# Patient Record
Sex: Female | Born: 1995
Health system: Southern US, Community
[De-identification: ages and names within clinical notes are randomized; demographics above are authoritative.]

## PROBLEM LIST (undated history)

## (undated) ENCOUNTER — Emergency Department: Payer: BC Managed Care – PPO

---
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
## 2013-05-07 ENCOUNTER — Emergency Department (HOSPITAL_COMMUNITY)
Admission: EM | Admit: 2013-05-07 | Discharge: 2013-05-07 | Disposition: A | Discharge status: Home / Self Care | Payer: Federal, State, Local not specified - PPO | Source: Home / Self Care | Attending: Emergency Medicine | Admitting: Emergency Medicine

## 2013-05-07 ENCOUNTER — Encounter (HOSPITAL_COMMUNITY): Payer: Self-pay | Admitting: Emergency Medicine

## 2013-05-07 DIAGNOSIS — N3000 Acute cystitis without hematuria: Secondary | ICD-10-CM

## 2013-05-07 LAB — POCT URINALYSIS DIP (DEVICE)
Bilirubin Urine: NEGATIVE
Glucose, UA: NEGATIVE mg/dL
Nitrite: NEGATIVE
Specific Gravity, Urine: 1.025 (ref 1.005–1.030)
Urobilinogen, UA: 0.2 mg/dL (ref 0.0–1.0)

## 2013-05-07 MED ORDER — CEPHALEXIN 500 MG PO CAPS: 500.0000 mg | ORAL_CAPSULE | Freq: Three times a day (TID) | ORAL | Status: DC

## 2013-05-07 MED ORDER — PHENAZOPYRIDINE HCL 200 MG PO TABS: 200.0000 mg | ORAL_TABLET | Freq: Three times a day (TID) | ORAL | Status: DC | PRN

## 2013-05-07 NOTE — ED Provider Notes (Signed)
Chief Complaint:   Chief Complaint  Patient presents with  . Urinary Frequency    History of Present Illness:   Erica Dodson is a 17 year old female who has had a history since this morning of dysuria, frequency, urgency, and urinary odor. She denies any hematuria, fever, chills, abdominal pain, back pain, nausea, or vomiting. She's had no GYN complaints. She does have a history of frequent urinary tract infections in the past and bacterial vaginosis. She thinks that intercourse may have been a causative factor.  Review of Systems:  Other than noted above, the patient denies any of the following symptoms: General:  No fevers, chills, sweats, aches, or fatigue. GI:  No abdominal pain, back pain, nausea, vomiting, diarrhea, or constipation. GU:  No dysuria, frequency, urgency, hematuria, or incontinence. GYN:  No discharge, itching, vulvar pain or lesions, pelvic pain, or abnormal vaginal bleeding.  PMFSH:  Past medical history, family history, social history, meds, and allergies were reviewed.    Physical Exam:   Vital signs:  BP 112/72  Pulse 72  Temp(Src) 98.6 F (37 C) (Oral)  Resp 18  SpO2 100%  LMP 04/16/2013 Gen:  Alert, oriented, in no distress. Lungs:  Clear to auscultation, no wheezes, rales or rhonchi. Heart:  Regular rhythm, no gallop or murmer. Abdomen:  Flat and soft. There was slight suprapubic pain to palpation.  No guarding, or rebound.  No hepato-splenomegaly or mass.  Bowel sounds were normally active.  No hernia. Back:  No CVA tenderness.  Skin:  Clear, warm and dry.  Labs:    Results for orders placed during the hospital encounter of 05/07/13  POCT URINALYSIS DIP (DEVICE)      Result Value Range   Glucose, UA NEGATIVE  NEGATIVE mg/dL   Bilirubin Urine NEGATIVE  NEGATIVE   Ketones, ur NEGATIVE  NEGATIVE mg/dL   Specific Gravity, Urine 1.025  1.005 - 1.030   Hgb urine dipstick SMALL (*) NEGATIVE   pH 6.0  5.0 - 8.0   Protein, ur NEGATIVE  NEGATIVE mg/dL   Urobilinogen, UA 0.2  0.0 - 1.0 mg/dL   Nitrite NEGATIVE  NEGATIVE   Leukocytes, UA LARGE (*) NEGATIVE  POCT PREGNANCY, URINE      Result Value Range   Preg Test, Ur NEGATIVE  NEGATIVE     A urine culture was obtained.  Results are pending at this time and we will call about any positive results.  Assessment: The encounter diagnosis was Acute cystitis.   No evidence of acute pyelonephritis.  Plan:   1.  Meds:  The following meds were prescribed:   New Prescriptions   CEPHALEXIN (KEFLEX) 500 MG CAPSULE    Take 1 capsule (500 mg total) by mouth 3 (three) times daily.   PHENAZOPYRIDINE (PYRIDIUM) 200 MG TABLET    Take 1 tablet (200 mg total) by mouth 3 (three) times daily as needed for pain.    2.  Patient Education/Counseling:  The patient was given appropriate handouts, self care instructions, and instructed in symptomatic relief. The patient was told to avoid intercourse for 10 days, get extra fluids, and return for a follow up with her primary care doctor at the completion of treatment for a repeat UA and culture.    3.  Follow up:  The patient was told to follow up if no better in 3 to 4 days, if becoming worse in any way, and given some red flag symptoms such as fever, severe back pain, or vomiting which would prompt immediate  return.  Follow up here or at the emergency department as needed.     Reuben Likes, MD 05/07/13 [phone]

## 2013-05-07 NOTE — ED Notes (Signed)
Pt  Re[ports  She  Woke  Up  This  Am  With  Urinary  Frequency  /  Discomfort  And    Foul  Smell      Pt  Has  Had  uti  In past  Feels  Similar

## 2013-05-10 LAB — URINE CULTURE

## 2013-05-10 NOTE — ED Notes (Signed)
Urine culture: 25,000 colonies E. Coli. Pt. adequately treated with Keflex. Vassie Moselle 05/10/2013

## 2015-04-27 ENCOUNTER — Encounter: Payer: Self-pay | Admitting: Family Medicine

## 2015-04-27 ENCOUNTER — Ambulatory Visit (INDEPENDENT_AMBULATORY_CARE_PROVIDER_SITE_OTHER): Payer: Federal, State, Local not specified - PPO | Admitting: Family Medicine

## 2015-04-27 VITALS — BP 123/70 | HR 55 | Temp 98.4°F | Resp 16 | Ht 68.0 in | Wt 181.0 lb

## 2015-04-27 DIAGNOSIS — R05 Cough: Secondary | ICD-10-CM

## 2015-04-27 DIAGNOSIS — R059 Cough, unspecified: Secondary | ICD-10-CM | POA: Insufficient documentation

## 2015-04-27 DIAGNOSIS — J302 Other seasonal allergic rhinitis: Secondary | ICD-10-CM | POA: Diagnosis not present

## 2015-04-27 MED ORDER — FLUTICASONE PROPIONATE 50 MCG/ACT NA SUSP: 2.0000 | Freq: Every day | NASAL | Status: DC

## 2015-04-27 MED ORDER — LORATADINE 10 MG PO TABS: 10.0000 mg | ORAL_TABLET | Freq: Every day | ORAL | Status: DC

## 2015-04-27 MED ORDER — ALBUTEROL SULFATE HFA 108 (90 BASE) MCG/ACT IN AERS: 2.0000 | INHALATION_SPRAY | Freq: Four times a day (QID) | RESPIRATORY_TRACT | Status: DC | PRN

## 2015-04-27 NOTE — Progress Notes (Signed)
Name: Erica BassetMegan Dodson   MRN: [id number]    DOB: 01/17/1996   Date:04/27/2015       Progress Note  Subjective  Chief Complaint  Chief Complaint  Patient presents with  . chest congestion    x 1 mos    HPI C/o chest congestion and some cough x 1 month.  Occ. Has yellow sputum.  [facility] [facility] [place] [facility] [place].  None seen by him.  No fever.  No SOB.  Does not interfere with exercise. Some PND.  Mild 0cc nasal  congestion.  No problem-specific assessment & plan notes found for this encounter.   History reviewed. No pertinent past medical history.  Social History  Substance Use Topics  . Smoking status: Never Smoker   . Smokeless tobacco: Never Used  . Alcohol Use: 0.0 oz/week    0 Standard drinks or equivalent per week     Comment: occasional     Current outpatient prescriptions:  .  Norgestimate-Ethinyl Estradiol Triphasic (TRI-SPRINTEC) 0.18/0.215/0.25 MG-35 MCG tablet, Take 1 tablet by mouth at bedtime., Disp: , Rfl:   Allergies  Allergen Reactions  . Sulfa Antibiotics Hives    Review of Systems  Constitutional: Negative for fever, chills, weight loss and malaise/fatigue.  HENT: Positive for congestion. Negative for hearing loss, nosebleeds and sore throat.   Eyes: Negative for blurred vision and double vision.  Respiratory: Positive for cough and sputum production (occ.). Negative for shortness of breath and wheezing.   Cardiovascular: Negative for chest pain, palpitations and leg swelling.  Gastrointestinal: Negative for heartburn, abdominal pain and blood in stool.  Genitourinary: Negative for dysuria, urgency and frequency.  Musculoskeletal: Negative for myalgias and joint pain.  Skin: Positive for rash.  Neurological: Negative for dizziness, tremors, weakness and headaches.      Objective  Filed Vitals:   04/27/15 0810  BP: 123/70  Pulse: 55  Temp: 98.4 F (36.9 C)  TempSrc: Oral  Resp: 16  Height: 5\' 8"  (1.727 m)  Weight: 181 lb  (82.101 kg)     Physical Exam  Constitutional: She is well-developed, well-nourished, and in no distress. No distress.  HENT:  Head: Normocephalic and atraumatic.  Right Ear: External ear normal.  Left Ear: External ear normal.  Nose: No mucosal edema or rhinorrhea. Right sinus exhibits no maxillary sinus tenderness and no frontal sinus tenderness. Left sinus exhibits no maxillary sinus tenderness and no frontal sinus tenderness.  Mouth/Throat: Oropharynx is clear and moist.  Eyes: Conjunctivae and EOM are normal. Pupils are equal, round, and reactive to light.  Neck: Normal range of motion. Neck supple. No thyromegaly present.  Cardiovascular: Normal rate, regular rhythm, normal heart sounds and intact distal pulses.  Exam reveals no gallop and no friction rub.   No murmur heard. Pulmonary/Chest: Effort normal and breath sounds normal. No respiratory distress. She has no wheezes. She has no rales. She exhibits no tenderness.  Lymphadenopathy:    She has no cervical adenopathy.  Vitals reviewed.     No results found for this or any previous visit (from the past 2160 hour(s)).   Assessment & Plan  1. Seasonal allergies  - loratadine (CLARITIN) 10 MG tablet; Take 1 tablet (10 mg total) by mouth daily.  Dispense: 30 tablet; Refill: 11 - fluticasone (FLONASE) 50 MCG/ACT nasal spray; Place 2 sprays into both nostrils daily.  Dispense: 16 g; Refill: 6  2. Cough  - albuterol (PROVENTIL HFA;VENTOLIN HFA) 108 (90 BASE) MCG/ACT inhaler; Inhale 2 puffs  into the lungs every 6 (six) hours as needed for wheezing or shortness of breath.  Dispense: 1 Inhaler; Refill: 6

## 2015-05-23 ENCOUNTER — Encounter: Payer: Self-pay | Admitting: Family Medicine

## 2016-04-21 DIAGNOSIS — N390 Urinary tract infection, site not specified: Secondary | ICD-10-CM | POA: Diagnosis not present

## 2016-06-24 ENCOUNTER — Other Ambulatory Visit: Payer: Self-pay | Admitting: Family Medicine

## 2016-06-24 DIAGNOSIS — R059 Cough, unspecified: Secondary | ICD-10-CM

## 2016-06-24 DIAGNOSIS — R05 Cough: Secondary | ICD-10-CM

## 2016-07-07 DIAGNOSIS — N3020 Other chronic cystitis without hematuria: Secondary | ICD-10-CM | POA: Diagnosis not present

## 2016-07-07 DIAGNOSIS — N390 Urinary tract infection, site not specified: Secondary | ICD-10-CM | POA: Diagnosis not present

## 2016-09-26 DIAGNOSIS — R52 Pain, unspecified: Secondary | ICD-10-CM | POA: Diagnosis not present

## 2016-09-26 DIAGNOSIS — L723 Sebaceous cyst: Secondary | ICD-10-CM | POA: Diagnosis not present

## 2016-10-15 DIAGNOSIS — D485 Neoplasm of uncertain behavior of skin: Secondary | ICD-10-CM | POA: Diagnosis not present

## 2016-10-15 DIAGNOSIS — D235 Other benign neoplasm of skin of trunk: Secondary | ICD-10-CM | POA: Diagnosis not present

## 2016-10-15 DIAGNOSIS — L720 Epidermal cyst: Secondary | ICD-10-CM | POA: Diagnosis not present

## 2016-12-23 DIAGNOSIS — B9689 Other specified bacterial agents as the cause of diseases classified elsewhere: Secondary | ICD-10-CM | POA: Diagnosis not present

## 2016-12-23 DIAGNOSIS — N760 Acute vaginitis: Secondary | ICD-10-CM | POA: Diagnosis not present

## 2017-01-05 DIAGNOSIS — B373 Candidiasis of vulva and vagina: Secondary | ICD-10-CM | POA: Diagnosis not present

## 2017-01-05 DIAGNOSIS — Z202 Contact with and (suspected) exposure to infections with a predominantly sexual mode of transmission: Secondary | ICD-10-CM | POA: Diagnosis not present

## 2017-01-22 DIAGNOSIS — A64 Unspecified sexually transmitted disease: Secondary | ICD-10-CM | POA: Diagnosis not present

## 2017-01-22 DIAGNOSIS — Z01419 Encounter for gynecological examination (general) (routine) without abnormal findings: Secondary | ICD-10-CM | POA: Diagnosis not present

## 2017-01-30 ENCOUNTER — Encounter: Payer: Federal, State, Local not specified - PPO | Admitting: Family Medicine

## 2017-02-10 DIAGNOSIS — L700 Acne vulgaris: Secondary | ICD-10-CM | POA: Diagnosis not present

## 2017-03-03 DIAGNOSIS — D485 Neoplasm of uncertain behavior of skin: Secondary | ICD-10-CM | POA: Diagnosis not present

## 2017-03-03 DIAGNOSIS — D2339 Other benign neoplasm of skin of other parts of face: Secondary | ICD-10-CM | POA: Diagnosis not present

## 2017-03-03 DIAGNOSIS — L0102 Bockhart's impetigo: Secondary | ICD-10-CM | POA: Diagnosis not present

## 2017-03-12 DIAGNOSIS — Z79899 Other long term (current) drug therapy: Secondary | ICD-10-CM | POA: Diagnosis not present

## 2017-03-12 DIAGNOSIS — L700 Acne vulgaris: Secondary | ICD-10-CM | POA: Diagnosis not present

## 2017-03-23 ENCOUNTER — Encounter: Payer: Self-pay | Admitting: Nurse Practitioner

## 2017-03-23 ENCOUNTER — Ambulatory Visit (INDEPENDENT_AMBULATORY_CARE_PROVIDER_SITE_OTHER): Payer: Federal, State, Local not specified - PPO | Admitting: Nurse Practitioner

## 2017-03-23 VITALS — BP 122/60 | HR 70 | Temp 98.1°F | Ht 69.0 in | Wt 188.4 lb

## 2017-03-23 DIAGNOSIS — Z7689 Persons encountering health services in other specified circumstances: Secondary | ICD-10-CM | POA: Diagnosis not present

## 2017-03-23 DIAGNOSIS — B9689 Other specified bacterial agents as the cause of diseases classified elsewhere: Secondary | ICD-10-CM | POA: Diagnosis not present

## 2017-03-23 DIAGNOSIS — N946 Dysmenorrhea, unspecified: Secondary | ICD-10-CM | POA: Diagnosis not present

## 2017-03-23 DIAGNOSIS — N949 Unspecified condition associated with female genital organs and menstrual cycle: Secondary | ICD-10-CM | POA: Diagnosis not present

## 2017-03-23 DIAGNOSIS — N760 Acute vaginitis: Secondary | ICD-10-CM | POA: Diagnosis not present

## 2017-03-23 DIAGNOSIS — R059 Cough, unspecified: Secondary | ICD-10-CM

## 2017-03-23 DIAGNOSIS — R05 Cough: Secondary | ICD-10-CM | POA: Diagnosis not present

## 2017-03-23 LAB — POCT WET PREP (WET MOUNT): Trichomonas Wet Prep HPF POC: ABSENT

## 2017-03-23 LAB — POCT URINALYSIS DIPSTICK
Bilirubin, UA: NEGATIVE
Glucose, UA: NEGATIVE
Ketones, UA: NEGATIVE
Nitrite, UA: NEGATIVE
Protein, UA: NEGATIVE
Spec Grav, UA: 1.01 (ref 1.010–1.025)
Urobilinogen, UA: 0.2 E.U./dL
pH, UA: 5 (ref 5.0–8.0)

## 2017-03-23 MED ORDER — ALBUTEROL SULFATE HFA 108 (90 BASE) MCG/ACT IN AERS: 2.0000 | INHALATION_SPRAY | Freq: Four times a day (QID) | RESPIRATORY_TRACT | 3 refills | Status: DC | PRN

## 2017-03-23 MED ORDER — METRONIDAZOLE 0.75 % VA GEL: 1.0000 | Freq: Every day | VAGINAL | 0 refills | Status: DC

## 2017-03-23 MED ORDER — METRONIDAZOLE 0.75 % VA GEL: 1.0000 | Freq: Every day | VAGINAL | 0 refills | Status: AC

## 2017-03-23 NOTE — Assessment & Plan Note (Addendum)
Pt occasionally uses an albuterol inhaler for wheezing and shortness of breath w/ asthma worse during seasonal allergies, but it is now expired.  She would like a refill. Refill provided.

## 2017-03-23 NOTE — Progress Notes (Signed)
Subjective:    Patient ID: Erica Dodson, female    DOB: 1995-12-24, 21 y.o.   MRN: [id number]  Erica Dodson is a 21 y.o. female presenting on 03/23/2017 for Menstrual Problem (polymenorrhea x 1.5 weeks. Pt also complains of vaginal bleeding. )   HPI Dysmenorrhea Pt has been taking Triphasic OCP and is the only prescribed OCP she has ever taken.  She started this at age 36 for regulation of menses, control of acne. - Saw GYN last month in [place] Dr. Elvina Sidle at [facility] and had her PAP smear done. - Forgot pills on trip, so she missed 3 doses and has skipped those missed pills without trying to catch up.  She resumed pillpack at current day due.  Her LMP was 10/6 and was very light.  Then started bleeding again on 10/10 and notes that her bleeding stopped today. - Was sexually active on days with missed pills.  She has used backup contraception, but inconsistently.  Vaginal Burning Birthday 10/6 and went to [place].  Used Paul Mi[place]chell citrus soap and had some vaginal burning.  Has recurrent BV and is sensitive to soaps.  She had burning while on her trip, but it resolved until she used the soap again after returning and had recurrence of burning. Symptoms include:  - Vaginal burning and discomfort.  - Smell is a little different that normal menses. - Denies pelvic pain, urinary frequency, urinary urgency.  Social History  Substance Use Topics  . Smoking status: Never Smoker  . Smokeless tobacco: Never Used  . Alcohol use 0.0 oz/week     Comment: occasional    Review of Systems Per HPI unless specifically indicated above     Objective:    BP 122/60 (BP Location: Right Arm, Patient Position: Sitting, Cuff Size: Normal)   Pulse 70   Temp 98.1 F (36.7 C) (Oral)   Ht  (1.753 m)   Wt 188 lb 6.4 oz (85.5 kg)   LMP 03/14/2017   BMI 27.82 kg/m   Wt Readings from Last 3 Encounters:  03/23/17 188 lb 6.4 oz (85.5 kg)  04/27/15 181 lb (82.1 kg)      Physical Exam  General - healthy, well-appearing, NAD HEENT - Normocephalic, atraumatic Neck - supple, non-tender, no LAD Heart - RRR, no murmurs heard Lungs - Clear throughout all lobes, no wheezing, crackles, or rhonchi. Normal work of breathing. Abdomen - soft, NTND, no masses, no hepatosplenomegaly, active bowel sounds GU - Normal external female genitalia without lesions or fusion. Vaginal canal without lesions. Normal appearing cervix without lesions or friability. Physiologic discharge on exam.  Extremeties - non-tender, no edema, cap refill < 2 seconds, peripheral pulses intact +2 bilaterally Skin - warm, dry, no rashes Neuro - awake, alert, oriented x3, normal coordination, normal gait Psych - Normal mood and affect, normal behavior   Results for orders placed or performed in visit on 03/23/17  POCT urinalysis dipstick  Result Value Ref Range   Color, UA yellow    Clarity, UA clear    Glucose, UA neg    Bilirubin, UA neg    Ketones, UA neg    Spec Grav, UA 1.010 1.010 - 1.025   Blood, UA Trace    pH, UA 5.0 5.0 - 8.0   Protein, UA neg    Urobilinogen, UA 0.2 0.2 or 1.0 E.U./dL   Nitrite, UA neg    Leukocytes, UA Small (1+) (A) Negative      Assessment &  Plan:   Problem List Items Addressed This Visit      Other   Cough    Pt occasionally uses an albuterol inhaler for wheezing and shortness of breath w/ asthma worse during seasonal allergies, but it is now expired.  She would like a refill. Refill provided.      Relevant Medications   albuterol (PROVENTIL HFA;VENTOLIN HFA) 108 (90 Base) MCG/ACT inhaler    Other Visit Diagnoses    Dysmenorrhea Bleeding outside of normal menses likely related to missed OCP x 3 days and breakthrough bleeding.  Also at risk for unintended pregnancy.  Plan: 1. Urine pregnancy test negative. 2. Resume pill packs and move into next pill pack after taking all pills. 3. Use backup contraception for next 6-8 weeks until 2nd new pill  pack.  Pt verbalizes understanding. 4. Follow up as needed.  Vaginal burning    -  Primary Pt w/ vaginal irritation and history of BV.  Also w/ sensitivity to soaps vaginally.  Has had recent antibiotic use and is unsure of what is causing her symptoms.    Plan: 1. POCT wet prep: = moderate clue cells indicates BV. 2. POCT urinalyisis: negative for UTI w/ likely contamination of vaginal secretions   Relevant Medications   metroNIDAZOLE (METROGEL) 0.75 % vaginal gel   Other Relevant Orders   POCT Wet Prep [facility])   POCT urinalysis dipstick (Completed)   Encounter for assessment of STD exposure       Pt has low risk behaviors, but would like to have gonorrhea, chlamydia tested.  Declines pan testing of syphilis and HIV.  Plan: 1. GC/ Chlamydia swab sent.   Relevant Orders   POCT Wet Prep ([place] [place])   GC/Chlamydia Probe Amp   BV (bacterial vaginosis)     Acute BV w/ symptoms of odorous discharge.  Plan: 1. Avoid new soap. 2. Apply 1 applicatorfull of metrogel to vagina nightly for 5 nights.  Avoid sexual intercourse for next 7 days during treatment. 3. If recurrent BV, consider boric acid vaginal suppository. 4. Follow up prn.   Relevant Medications   metroNIDAZOLE (METROGEL) 0.75 % vaginal gel      Meds ordered this encounter  Medications  . DISCONTD: metroNIDAZOLE (METROGEL) 0.75 % vaginal gel    Sig: Place 1 Applicatorful vaginally at bedtime.    Dispense:  50 g    Refill:  0    Order Specific Question:   Supervising Provider    Answer:   Smitty Cords [[id number]]  . metroNIDAZOLE (METROGEL) 0.75 % vaginal gel    Sig: Place 1 Applicatorful vaginally at bedtime.    Dispense:  50 g    Refill:  0    Order Specific Question:   Supervising Provider    Answer:   Smitty Cords [[id number]]  . albuterol (PROVENTIL HFA;VENTOLIN HFA) 108 (90 Base) MCG/ACT inhaler    Sig: Inhale 2 puffs into the lungs every 6 (six) hours as needed for wheezing or shortness of  breath.    Dispense:  1 Inhaler    Refill:  3      Follow up plan: Return 5-7 days if symptoms worsen or fail to improve.   Wilhelmina Mcardle, DNP, AGPCNP-BC Adult Gerontology Primary Care Nurse Practitioner [facility] 03/23/2017, 5:39 PM

## 2017-03-23 NOTE — Patient Instructions (Addendum)
Erica Dodson, Thank you for coming in to clinic today.  1. For your bleeding: - Resume your current pill pack and take birth control pill daily.  Resume next pill pack as you normally would.  - Use backup contraception through your next full pill pack.  2. You do not have yeast infection.  You do have a few clue cells which can indicate you have early BV.  Please schedule a follow-up appointment with Wilhelmina Mcardle, AGNP. Return 5-7 days if symptoms worsen or fail to improve.  If you have any other questions or concerns, please feel free to call the clinic or send a message through MyChart. You may also schedule an earlier appointment if necessary.  You will receive a survey after today's visit either digitally by e-mail or paper by [employer]. Your experiences and feedback matter to [place].  Please respond so we know how we are doing as we provide care for you.   Wilhelmina Mcardle, DNP, AGNP-BC Adult Gerontology Nurse Practitioner [facility], [facility]

## 2017-03-27 ENCOUNTER — Encounter: Payer: Self-pay | Admitting: Nurse Practitioner

## 2017-03-27 DIAGNOSIS — N3000 Acute cystitis without hematuria: Secondary | ICD-10-CM

## 2017-03-27 MED ORDER — CEPHALEXIN 500 MG PO CAPS: 500.0000 mg | ORAL_CAPSULE | Freq: Two times a day (BID) | ORAL | 0 refills | Status: AC

## 2017-03-27 NOTE — Addendum Note (Signed)
Addended by: Wilhelmina McardleKENNEDY, Miyonna Ormiston R on: 03/27/2017 05:15 PM   Modules accepted: Orders

## 2017-03-30 ENCOUNTER — Ambulatory Visit (INDEPENDENT_AMBULATORY_CARE_PROVIDER_SITE_OTHER): Payer: Federal, State, Local not specified - PPO | Admitting: Nurse Practitioner

## 2017-03-30 ENCOUNTER — Encounter: Payer: Self-pay | Admitting: Nurse Practitioner

## 2017-03-30 VITALS — BP 115/57 | HR 73 | Temp 97.7°F | Ht 69.0 in | Wt 184.4 lb

## 2017-03-30 DIAGNOSIS — R300 Dysuria: Secondary | ICD-10-CM

## 2017-03-30 DIAGNOSIS — L309 Dermatitis, unspecified: Secondary | ICD-10-CM

## 2017-03-30 LAB — POCT URINALYSIS DIPSTICK
Bilirubin, UA: NEGATIVE
Blood, UA: NEGATIVE
Glucose, UA: NEGATIVE
Ketones, UA: NEGATIVE
Leukocytes, UA: NEGATIVE
Nitrite, UA: NEGATIVE
Protein, UA: NEGATIVE
Spec Grav, UA: 1.01 (ref 1.010–1.025)
Urobilinogen, UA: 0.2 E.U./dL
pH, UA: 7 (ref 5.0–8.0)

## 2017-03-30 NOTE — Progress Notes (Signed)
Subjective:    Patient ID: Martin Smeal, female    DOB: [birth year], 21 y.o.   MRN: [id number]  Aneliese Beaudry is a 21 y.o. female presenting on 03/30/2017 for Dysuria (burning after urination)   HPI Dysuria followup w/ vaginal burning: Initial visit 03/23/17 for vaginal burning and dysuria showed presence of BV.  Assumed since only small leukocytes in urine that was likely contaminant of vaginal secretions.  Pt was started on intravaginal metronidazole.  Used 2 doses before contacting via MyChart about symptoms.  Pt was instructed to stop using metronidazole and was provided empiric coverage for UTI w/ Keflex 500 mg bid x 5 days.   Persistent and worsening vaginal burning after urination.  Pt reports had irritation with intravaginal metronidazole.  Stopped using it when advised to stop.  Did start empiric UTI treatment with Keflex on Friday and has 5 doses remaining.  Reports worsening vaginal irritation w/ throbbing pain since Saturday.  Minimal improvement today, but overall still has worse vaginal burning symptoms than initially last week.  GC/Chlamydia test results: called [facility] to obtain.  Both are negative.  Trichomonas also negative on Wet Prep 03/23/17.  Reviewed w/ pt today.   Today, pt declines testing for HIV and Syphilis.   Social History  Substance Use Topics  . Smoking status: Never Smoker  . Smokeless tobacco: Never Used  . Alcohol use 0.0 oz/week     Comment: occasional    Review of Systems Per HPI unless specifically indicated above     Objective:    BP (!) 115/57 (BP Location: Right Arm, Patient Position: Sitting, Cuff Size: Normal)   Pulse 73   Temp 97.7 F (36.5 C) (Oral)   Ht 5\' 9"  (1.753 m)   Wt 184 lb 6.4 oz (83.6 kg)   LMP 03/14/2017   BMI 27.23 kg/m   Wt Readings from Last 3 Encounters:  03/30/17 184 lb 6.4 oz (83.6 kg)  03/23/17 188 lb 6.4 oz (85.5 kg)  04/27/15 181 lb (82.1 kg)    Physical Exam  Constitutional: She is oriented to person, place,  and time. She appears well-developed and well-nourished. No distress.  HENT:  Head: Normocephalic and atraumatic.  Cardiovascular: Normal rate, regular rhythm and normal heart sounds.   Pulmonary/Chest: Effort normal and breath sounds normal. No respiratory distress.  Abdominal: Hernia confirmed negative in the right inguinal area and confirmed negative in the left inguinal area.  Genitourinary: Vagina normal. No labial fusion. There is rash on the right labia. There is no tenderness, lesion or injury on the right labia. There is rash on the left labia. There is no tenderness, lesion or injury on the left labia. Cervix exhibits no motion tenderness, no discharge and no friability. No vaginal discharge found.  Genitourinary Comments: External exam consistent w/ vulvar dermatitis caused by topical metronidazole treatment.  Mild vulvar erythema present.  Lymphadenopathy:       Right: No inguinal adenopathy present.       Left: No inguinal adenopathy present.  Neurological: She is alert and oriented to person, place, and time. No cranial nerve deficit.  Skin: Skin is warm and dry.  Psychiatric: She has a normal mood and affect. Her behavior is normal. Judgment and thought content normal.   Results for orders placed or performed in visit on 03/30/17  POCT Urinalysis Dipstick  Result Value Ref Range   Color, UA yellow    Clarity, UA clear    Glucose, UA neg    Bilirubin, UA neg  Ketones, UA neg    Spec Grav, UA 1.010 1.010 - 1.025   Blood, UA neg    pH, UA 7.0 5.0 - 8.0   Protein, UA neg    Urobilinogen, UA 0.2 0.2 or 1.0 E.U./dL   Nitrite, UA neg    Leukocytes, UA Negative Negative      Assessment & Plan:   Problem List Items Addressed This Visit    None    Visit Diagnoses    Dysuria    -  Primary Pain of vulva after irritation.  Pt already on empiric treatment for UTI.  Plan: 1. Repeat UA and culture.  Pt desires urine culture be sent today even though she is already on  treatment. 2. Followup as needed.   Relevant Orders   POCT Urinalysis Dipstick (Completed)   Urine Culture   Vulvar dermatitis     Pt reported worsening vaginal burning after urination after using intravaginal metronidazole.  Vaginal exam today reveals dermatitis.  No persistent thin, white discharge indicating active BV infection.  Plan: 1. Continue mild soap and water cleansing. 2. STOP using intravaginal metronidazole (added to allergy list as intolerance). 3. Reviewed side effect of metronidazole, symptoms, and provided explanations.  Pt has had all questions answered. 4. Followup as needed in 7-10 days if symptoms persist.       Follow up plan: Return 7-10 days if symptoms worsen or fail to improve.  Wilhelmina McardleLauren Rashanna Christiana, DNP, AGPCNP-BC Adult Gerontology Primary Care Nurse Practitioner [facility] 03/30/2017, 12:11 PM

## 2017-03-31 ENCOUNTER — Other Ambulatory Visit: Payer: Self-pay

## 2017-03-31 LAB — URINE CULTURE
MICRO NUMBER:: 81179229
Result:: NO GROWTH
SPECIMEN QUALITY:: ADEQUATE

## 2017-06-11 ENCOUNTER — Other Ambulatory Visit: Payer: Self-pay

## 2017-06-11 ENCOUNTER — Other Ambulatory Visit: Payer: Self-pay | Admitting: Nurse Practitioner

## 2017-06-11 ENCOUNTER — Ambulatory Visit (INDEPENDENT_AMBULATORY_CARE_PROVIDER_SITE_OTHER): Payer: Federal, State, Local not specified - PPO | Admitting: Nurse Practitioner

## 2017-06-11 ENCOUNTER — Encounter: Payer: Self-pay | Admitting: Nurse Practitioner

## 2017-06-11 VITALS — BP 120/58 | HR 76 | Temp 98.6°F | Ht 69.0 in | Wt 181.2 lb

## 2017-06-11 DIAGNOSIS — B373 Candidiasis of vulva and vagina: Secondary | ICD-10-CM | POA: Diagnosis not present

## 2017-06-11 DIAGNOSIS — Z7689 Persons encountering health services in other specified circumstances: Secondary | ICD-10-CM | POA: Diagnosis not present

## 2017-06-11 DIAGNOSIS — B3731 Acute candidiasis of vulva and vagina: Secondary | ICD-10-CM

## 2017-06-11 LAB — POCT WET PREP (WET MOUNT)
Clue Cells Wet Prep Whiff POC: NEGATIVE
Trichomonas Wet Prep HPF POC: ABSENT

## 2017-06-11 MED ORDER — FLUCONAZOLE 150 MG PO TABS: 150.0000 mg | ORAL_TABLET | Freq: Once | ORAL | 0 refills | Status: AC

## 2017-06-11 NOTE — Patient Instructions (Addendum)
Aundra MilletMegan, Thank you for coming in to clinic today.  1. You have a yeast infection. - Take diflucan 150 mg once today.  Please schedule a follow-up appointment with Wilhelmina McardleLauren Diem Dicocco, AGNP. Return 5-7 days if symptoms worsen or fail to improve.  If you have any other questions or concerns, please feel free to call the clinic or send a message through MyChart. You may also schedule an earlier appointment if necessary.  You will receive a survey after today's visit either digitally by e-mail or paper by [employer] mail. Your experiences and feedback matter to us.  Please respond so we know how we are doing as we provide care for you.   Wilhelmina McardleLauren Evelise Reine, DNP, AGNP-BC Adult Gerontology Nurse Practitioner [facility], [facility]

## 2017-06-11 NOTE — Progress Notes (Signed)
Subjective:    Patient ID: Erica BassetMegan Conrad, female    DOB: 10/12/1995, 22 y.o.   MRN: [id number]  Erica Dodson is a 22 y.o. female presenting on 06/11/2017 for Vaginal Discharge ( vaginal odor, pt states the last time she came in diagnosis w/ BV and admits that she did not finish treatment. concern about possible exposure to STD)   HPI Vaginal Discharge Has had recurrence of vaginal discharge (thin, watery white) with odor.  Now no odor.  Used metrogel once as it was leftover from prior BV dx.  Used 2-3 doses last time, but had irritation so stopped use.  Has not had recurrence of vaginal irritation.  Encounter for possible STD exposure Unprotected sex 4 days ago.  Now more than one partner in last 6 months.  She is sexually active with only one partner at a time, but has entered a new relationship.  Desires STD testing today.  She notes new vaginal lesions, lumps, bumps, or odorous discharge.  Social History   Tobacco Use  . Smoking status: Never Smoker  . Smokeless tobacco: Never Used  Substance Use Topics  . Alcohol use: Yes    Alcohol/week: 0.0 oz    Comment: occasional  . Drug use: No    Review of Systems Per HPI unless specifically indicated above     Objective:    BP (!) 120/58 (BP Location: Right Arm, Patient Position: Sitting, Cuff Size: Normal)   Pulse 76   Temp 98.6 F (37 C) (Oral)   Ht 5\' 9"  (1.753 m)   Wt 181 lb 3.2 oz (82.2 kg)   BMI 26.76 kg/m   Wt Readings from Last 3 Encounters:  06/11/17 181 lb 3.2 oz (82.2 kg)  03/30/17 184 lb 6.4 oz (83.6 kg)  03/23/17 188 lb 6.4 oz (85.5 kg)    Physical Exam  Constitutional: She is oriented to person, place, and time. She appears well-developed and well-nourished. No distress.  HENT:  Head: Normocephalic and atraumatic.  Cardiovascular: Normal rate, regular rhythm, normal heart sounds and intact distal pulses.  Pulmonary/Chest: Effort normal and breath sounds normal. No respiratory distress.  Abdominal: Soft. Bowel  sounds are normal. She exhibits no distension and no mass. There is no tenderness.  Genitourinary: Pelvic exam was performed with patient supine.  Genitourinary Comments: Normal external female genitalia without lesions or fusion. Vaginal canal without lesions. Normal appearing cervix without lesions or friability. Thick white discharge on exam. Bimanual exam without adnexal masses, enlarged uterus, or cervical motion tenderness.  Neurological: She is alert and oriented to person, place, and time.  Skin: Skin is warm and dry.  Psychiatric: She has a normal mood and affect. Her behavior is normal. Judgment and thought content normal.     Results for orders placed or performed in visit on 06/11/17  HIV antibody  Result Value Ref Range   HIV Screen 4th Generation wRfx Non Reactive Non Reactive  RPR  Result Value Ref Range   RPR Ser Ql Non Reactive Non Reactive  HSV(herpes simplex vrs) 1+2 ab-IgG  Result Value Ref Range   HSV 1 Glycoprotein G Ab, IgG 0.96 (H) 0.00 - 0.90 index   HSV 2 IgG, Type Spec <0.91 0.00 - 0.90 index  POCT Wet Prep [employer]([facility] [place])  Result Value Ref Range   Source Wet Prep POC vagina    WBC, Wet Prep HPF POC moderate    Bacteria Wet Prep HPF POC Few Few   BACTERIA WET PREP MORPHOLOGY POC  Clue Cells Wet Prep HPF POC None None   Clue Cells Wet Prep Whiff POC Negative Whiff    Yeast Wet Prep HPF POC Moderate    KOH Wet Prep POC performed    Trichomonas Wet Prep HPF POC Absent Absent      Assessment & Plan:   Problem List Items Addressed This Visit    None    Visit Diagnoses    Vaginal candidiasis    -  Primary Acute vaginal candidiasis noted on Wet Prep with moderate yeast buds and few yeast hyphae present.    Plan: 1. START diflucan 150 mg once today. 2. Encouraged pt to keep undergarments clean and dry. 3. Reassured pt is not sexually transmitted, but can have recurrent infection after treatment of BV or with different sexual partners and changes in  vaginal pH.  Discouraged use of douching. 4. Followup as needed.   Relevant Orders   POCT Wet Prep [facility]) (Completed)   Encounter for assessment of STD exposure     Pt with acute concern for STD exposure after single encounter of unprotected sex with new partner.  Pt has had more than one partner in last 6 months and is at minimally higher risk of STD exposure with single sexual relationship at any given time to date.  No active symptoms of STD are noted.  Plan: 1. Obtain vaginal specimen for Wet Prep and Chlamydia and Gonorrhea. 2. Obtain blood specimen for HIV, RPR, HSV1/HSV2 ab. 3. Counseled pt on safe sex practices. 4. Followup as needed after labs for results.   Relevant Orders   POCT Wet Prep ([place] [place]) (Completed)   C. trachomatis/N. gonorrhoeae RNA   HIV antibody   HSV 2 antibody, IgG   HIV antibody (Completed)   RPR (Completed)   GC/Chlamydia Probe Amp   HSV(herpes simplex vrs) 1+2 ab-IgG (Completed)      Meds ordered this encounter  Medications  . fluconazole (DIFLUCAN) 150 MG tablet    Sig: Take 1 tablet (150 mg total) by mouth once for 1 dose.    Dispense:  1 tablet    Refill:  0    Order Specific Question:   Supervising Provider    Answer:   Smitty Cords [[id number]]    Follow up plan: Return 5-7 days if symptoms worsen or fail to improve.  Wilhelmina Mcardle, DNP, AGPCNP-BC Adult Gerontology Primary Care Nurse Practitioner [facility] 06/11/2017, 9:43 AM

## 2017-06-13 LAB — HSV(HERPES SIMPLEX VRS) I + II AB-IGG
HSV 1 Glycoprotein G Ab, IgG: 0.96 index — ABNORMAL HIGH (ref 0.00–0.90)
HSV 2 IgG, Type Spec: <0.91 index (ref 0.00–0.90)

## 2017-06-13 LAB — HIV ANTIBODY (ROUTINE TESTING W REFLEX): HIV Screen 4th Generation wRfx: NONREACTIVE

## 2017-06-13 LAB — RPR: RPR Ser Ql: NONREACTIVE

## 2017-06-13 LAB — GC/CHLAMYDIA PROBE AMP
Chlamydia trachomatis, NAA: NEGATIVE
Neisseria gonorrhoeae by PCR: NEGATIVE

## 2017-06-15 ENCOUNTER — Encounter: Payer: Self-pay | Admitting: Nurse Practitioner

## 2017-06-30 DIAGNOSIS — L700 Acne vulgaris: Secondary | ICD-10-CM | POA: Diagnosis not present

## 2017-06-30 DIAGNOSIS — Z79899 Other long term (current) drug therapy: Secondary | ICD-10-CM | POA: Diagnosis not present

## 2017-07-09 ENCOUNTER — Encounter: Payer: Self-pay | Admitting: Nurse Practitioner

## 2017-07-09 ENCOUNTER — Other Ambulatory Visit: Payer: Self-pay

## 2017-07-09 ENCOUNTER — Ambulatory Visit (INDEPENDENT_AMBULATORY_CARE_PROVIDER_SITE_OTHER): Payer: Federal, State, Local not specified - PPO | Admitting: Nurse Practitioner

## 2017-07-09 VITALS — BP 110/54 | HR 81 | Temp 97.3°F | Ht 69.0 in | Wt 186.2 lb

## 2017-07-09 DIAGNOSIS — N760 Acute vaginitis: Secondary | ICD-10-CM | POA: Diagnosis not present

## 2017-07-09 DIAGNOSIS — B3731 Acute candidiasis of vulva and vagina: Secondary | ICD-10-CM

## 2017-07-09 DIAGNOSIS — B9689 Other specified bacterial agents as the cause of diseases classified elsewhere: Secondary | ICD-10-CM

## 2017-07-09 DIAGNOSIS — B373 Candidiasis of vulva and vagina: Secondary | ICD-10-CM | POA: Diagnosis not present

## 2017-07-09 LAB — POCT WET PREP (WET MOUNT)
Clue Cells Wet Prep Whiff POC: NEGATIVE
Trichomonas Wet Prep HPF POC: ABSENT

## 2017-07-09 MED ORDER — METRONIDAZOLE 500 MG PO TABS: 500.0000 mg | ORAL_TABLET | Freq: Three times a day (TID) | ORAL | 0 refills | Status: DC

## 2017-07-09 MED ORDER — FLUCONAZOLE 150 MG PO TABS: 150.0000 mg | ORAL_TABLET | Freq: Once | ORAL | 0 refills | Status: AC

## 2017-07-09 NOTE — Progress Notes (Signed)
Subjective:    Patient ID: Erica Dodson, female    DOB: 08/15/1995, 22 y.o.   MRN: [id number]  Erica Dodson is a 22 y.o. female presenting on 07/09/2017 for Vaginitis (whitish vaginal discharge, itchimg. The pt used the OTC yeast cream for 1 day and itching subsided x 4 days )   HPI Vaginitis Pt reports thick white vaginal discharge onset 4-5 days ago.  Has used OTC vaginal suppository yeast infection cream x 1 day and itching subsided but discharge has not improved.  No dysuria, odor.   - Pt reports is now doing crossfit and is staying in moist gym clothing after her workouts. - Recent sexual intercourse: none   Social History   Tobacco Use  . Smoking status: Never Smoker  . Smokeless tobacco: Never Used  Substance Use Topics  . Alcohol use: Yes    Alcohol/week: 0.0 oz    Comment: occasional  . Drug use: No    Review of Systems Per HPI unless specifically indicated above     Objective:    BP (!) 110/54 (BP Location: Right Arm, Patient Position: Sitting, Cuff Size: Normal)   Pulse 81   Temp (!) 97.3 F (36.3 C) (Oral)   Ht 5\' 9"  (1.753 m)   Wt 186 lb 3.2 oz (84.5 kg)   LMP 06/23/2017 (Approximate)   BMI 27.50 kg/m   Wt Readings from Last 3 Encounters:  07/09/17 186 lb 3.2 oz (84.5 kg)  06/11/17 181 lb 3.2 oz (82.2 kg)  03/30/17 184 lb 6.4 oz (83.6 kg)    Physical Exam  General - overweight, well-appearing, NAD HEENT - Normocephalic, atraumatic Extremeties - non-tender, no edema, cap refill < 2 seconds, peripheral pulses intact +2 bilaterally GU - Normal external female genitalia without lesions or fusion. Vaginal canal without lesions. Normal appearing cervix without lesions or friability. Thick, white discharge on exam without odor. Bimanual exam without adnexal masses, enlarged uterus, or cervical motion tenderness. Skin - warm, dry Neuro - awake, alert, oriented x3, normal gait Psych - Normal mood and affect, normal behavior    Results for orders placed or  performed in visit on 07/09/17  POCT Wet Prep [facility] [place])  Result Value Ref Range   Source Wet Prep POC vaginal    WBC, Wet Prep HPF POC few    Bacteria Wet Prep HPF POC Moderate (A) Few   BACTERIA WET PREP MORPHOLOGY POC     Clue Cells Wet Prep HPF POC Moderate (A) None   Clue Cells Wet Prep Whiff POC Negative Whiff    Yeast Wet Prep HPF POC Moderate    KOH Wet Prep POC     Trichomonas Wet Prep HPF POC Absent Absent      Assessment & Plan:   Problem List Items Addressed This Visit    None    Visit Diagnoses    BV (bacterial vaginosis)    -  Primary   Relevant Medications   metroNIDAZOLE (FLAGYL) 500 MG tablet   fluconazole (DIFLUCAN) 150 MG tablet   Other Relevant Orders   POCT Wet Prep [employer]([facility] [place]) (Completed)   Vaginal candidiasis       Relevant Medications   metroNIDAZOLE (FLAGYL) 500 MG tablet   fluconazole (DIFLUCAN) 150 MG tablet   Other Relevant Orders   POCT Wet Prep [facility] [place]) (Completed)     Pt with recurrent and alternating BV and vaginal candidiasis infections.  Today, wet prep shows evidence of concurrent infections.  Will treat both conditions in  hopes of stabilizing the cycle of persistent vaginitis.  No evidence of STI and no increased risk of STI. - START diflucan 150 mg once  - START metronidazole 500 mg bid x 7 days - Encouraged pt to wear clean dry clothes after workouts. - Follouwp as needed for recurrence.  Consider boric acid for BV prevention if BV recurs.     Meds ordered this encounter  Medications  . metroNIDAZOLE (FLAGYL) 500 MG tablet    Sig: Take 1 tablet (500 mg total) by mouth 3 (three) times daily.    Dispense:  21 tablet    Refill:  0    Order Specific Question:   Supervising Provider    Answer:   Smitty Cords [[id number]]  . fluconazole (DIFLUCAN) 150 MG tablet    Sig: Take 1 tablet (150 mg total) by mouth once for 1 dose.    Dispense:  1 tablet    Refill:  0    Order Specific Question:   Supervising Provider    Answer:    Smitty Cords [[id number]]      Follow up plan: Return if symptoms worsen or fail to improve.   Wilhelmina Mcardle, DNP, AGPCNP-BC Adult Gerontology Primary Care Nurse Practitioner [facility] 07/09/2017, 12:03 PM

## 2017-07-09 NOTE — Patient Instructions (Addendum)
Aundra MilletMegan, Thank you for coming in to clinic today.  1. You have both some BV and yeast today.  Since you are having recurrent and alternating infections, I am going to treat for both. - START diflucan 150 mg tablet x 1 for one dose. - START metronidazole 500 mg bid x 7 days  2. If you still have repeated BV infections, we can use boric acid suppositories for prevention so we can prevent yeast infections after treatment of BV.  Can call clinic if these symptoms occur and we will let you know if you need an appointment.  Keep clean dry clothing after workouts.  Please schedule a follow-up appointment with Wilhelmina McardleLauren Terron Merfeld, AGNP. Return if symptoms worsen or fail to improve.  If you have any other questions or concerns, please feel free to call the clinic or send a message through MyChart. You may also schedule an earlier appointment if necessary.  You will receive a survey after today's visit either digitally by e-mail or paper by [employer] mail. Your experiences and feedback matter to us.  Please respond so we know how we are doing as we provide care for you.   Wilhelmina McardleLauren Anikin Prosser, DNP, AGNP-BC Adult Gerontology Nurse Practitioner [facility], [facility]

## 2017-08-04 DIAGNOSIS — Z79899 Other long term (current) drug therapy: Secondary | ICD-10-CM | POA: Diagnosis not present

## 2017-08-04 DIAGNOSIS — L700 Acne vulgaris: Secondary | ICD-10-CM | POA: Diagnosis not present

## 2017-08-07 ENCOUNTER — Ambulatory Visit: Payer: Federal, State, Local not specified - PPO | Admitting: Nurse Practitioner

## 2017-09-08 DIAGNOSIS — L700 Acne vulgaris: Secondary | ICD-10-CM | POA: Diagnosis not present

## 2017-09-08 DIAGNOSIS — Z79899 Other long term (current) drug therapy: Secondary | ICD-10-CM | POA: Diagnosis not present

## 2017-09-20 ENCOUNTER — Encounter: Payer: Self-pay | Admitting: Emergency Medicine

## 2017-09-20 ENCOUNTER — Ambulatory Visit
Admission: EM | Admit: 2017-09-20 | Discharge: 2017-09-20 | Disposition: A | Discharge status: Home / Self Care | Payer: Federal, State, Local not specified - PPO | Attending: Family Medicine | Admitting: Family Medicine

## 2017-09-20 ENCOUNTER — Other Ambulatory Visit: Payer: Self-pay

## 2017-09-20 ENCOUNTER — Ambulatory Visit (INDEPENDENT_AMBULATORY_CARE_PROVIDER_SITE_OTHER): Payer: Federal, State, Local not specified - PPO

## 2017-09-20 DIAGNOSIS — M79642 Pain in left hand: Secondary | ICD-10-CM

## 2017-09-20 DIAGNOSIS — S6992XA Unspecified injury of left wrist, hand and finger(s), initial encounter: Secondary | ICD-10-CM | POA: Diagnosis not present

## 2017-09-20 DIAGNOSIS — M25532 Pain in left wrist: Secondary | ICD-10-CM

## 2017-09-20 DIAGNOSIS — S62115A Nondisplaced fracture of triquetrum [cuneiform] bone, left wrist, initial encounter for closed fracture: Secondary | ICD-10-CM | POA: Diagnosis not present

## 2017-09-20 MED ORDER — OXYCODONE-ACETAMINOPHEN 5-325 MG PO TABS: 1.0000 | ORAL_TABLET | Freq: Three times a day (TID) | ORAL | 0 refills | Status: DC | PRN

## 2017-09-20 NOTE — ED Triage Notes (Signed)
Patient in today c/o left wrist pain after she hit it against a wooden post this morning. Patient has iced her wrist.

## 2017-09-20 NOTE — Discharge Instructions (Signed)
° ° ° ° ° ° ° °  Ice. Elevate.Rest. Drink plenty of fluids.   Follow up with orthopedic as discussed.   Follow up with your primary care physician this week as needed. Return to Urgent care for new or worsening concerns.         Procedures

## 2017-09-20 NOTE — ED Provider Notes (Addendum)
[facility] ____________________________________________  Time seen: Approximately 4:06 PM  I have reviewed the triage vital signs and the nursing notes.   HISTORY  Chief Complaint Wrist Injury (DOI I4/14/19)   HPI Erica Dodson is a 22 y.o. female presenting for evaluation of left wrist pain after injury that occurred earlier today.  Patient states that she was outside in a B landed on her shirt, and states that she had a spastic moment and swaddled to be away, and hit her left wrist on a wooden post.  Reports pain since injury.  Denies fall to the ground, head injury, loss of conscious or other pain or injuries.  States pain is worse in the left wrist.  Denies paresthesias, pain radiation, other pain or injuries.  No over-the-counter medications taken today prior to arrival.  Did apply some ice which helps some.  States pain is worse with movement and direct palpation.  Reports otherwise feels well.  Reports right-hand dominant.  Denies previous injuries to the same area.  Denies recent sickness. Denies recent antibiotic use.   Galen Manila, NP: PCP Patient's last menstrual period was 09/19/2017.  Denies pregnancy.   History reviewed. No pertinent past medical history.  Patient Active Problem List   Diagnosis Date Noted  . Seasonal allergies 04/27/2015  . Cough 04/27/2015    History reviewed. No pertinent surgical history.   No current facility-administered medications for this encounter.   Current Outpatient Medications:  .  CLARAVIS 40 MG capsule, Take 1 capsule by mouth 2 (two) times daily., Disp: , Rfl: 0 .  Norgestimate-Ethinyl Estradiol Triphasic (TRI-SPRINTEC) 0.18/0.215/0.25 MG-35 MCG tablet, Take 1 tablet by mouth at bedtime., Disp: , Rfl:  .  oxyCODONE-acetaminophen (PERCOCET/ROXICET) 5-325 MG tablet, Take 1 tablet by mouth every 8 (eight) hours as needed for severe pain. Do not drive while taking as can cause drowsiness., Disp: 6 tablet, Rfl:  0  Allergies Metronidazole and Sulfa antibiotics  Family History  Problem Relation Age of Onset  . Heart Problems Father   . Asthma Brother     Social History Social History   Tobacco Use  . Smoking status: Never Smoker  . Smokeless tobacco: Never Used  Substance Use Topics  . Alcohol use: Yes    Alcohol/week: 0.0 oz    Comment: occasional  . Drug use: No    Review of Systems Constitutional: No fever/chills Cardiovascular: Denies chest pain. Respiratory: Denies shortness of breath. Musculoskeletal: Negative for back pain.As abov  Skin: Negative for rash. ____________________________________   PHYSICAL EXAM:  VITAL SIGNS: ED Triage Vitals  Enc Vitals Group     BP 09/20/17 1522 113/76     Pulse Rate 09/20/17 1522 90     Resp 09/20/17 1522 16     Temp 09/20/17 1522 98.3 F (36.8 C)     Temp Source 09/20/17 1522 Oral     SpO2 09/20/17 1522 100 %     Weight 09/20/17 1521 186 lb (84.4 kg)     Height 09/20/17 1521 5\' 8"  (1.727 m)     Head Circumference --      Peak Flow --      Pain Score 09/20/17 1521 7     Pain Loc --      Pain Edu? --      Excl. in GC? --     Constitutional: Alert and oriented. Well appearing and in no acute distress. Cardiovascular: Normal rate, regular rhythm. Grossly normal heart sounds.  Good peripheral circulation. Respiratory: Normal respiratory  effort without tachypnea nor retractions. Breath sounds are clear and equal bilaterally. No wheezes, rales, rhonchi. Musculoskeletal:  No midline cervical, thoracic or lumbar tenderness to palpation. Bilateral distal radial pulses equal and easily palpated. Except: Left wrist mid to lateral dorsal wrist moderate tenderness to direct palpation with mild localized swelling, mild pain to wrist with resisted third and fourth fingers flexion and extension but full range of motion present, no motor or tendon deficit noted to left hand, normal distal sensation and capillary refill, left upper extremity  otherwise nontender. Neurologic:  Normal speech and language.  Speech is normal. No gait instability.  Skin:  Skin is warm, dry and intact. No rash noted. Psychiatric: Mood and affect are normal. Speech and behavior are normal. Patient exhibits appropriate insight and judgment   ___________________________________________   LABS (all labs ordered are listed, but only abnormal results are displayed)  Labs Reviewed - No data to display  RADIOLOGY  Dg Wrist Complete Left  Result Date: 09/20/2017 CLINICAL DATA:  Hit wrist against wooden pole EXAM: LEFT WRIST - COMPLETE 3+ VIEW COMPARISON:  None FINDINGS: Frontal, oblique lateral, and ulnar deviation scaphoid images were obtained. No evident fracture or dislocation. Joint spaces appear normal. No erosive change. No radiopaque foreign body. IMPRESSION: No fracture or dislocation. No appreciable arthropathy. No radiopaque foreign body evident. Electronically Signed   By: Bretta Bang III M.D.   On: 09/20/2017 15:49   Dg Hand Complete Left  Result Date: 09/20/2017 CLINICAL DATA:  Hit hand against wooden post EXAM: LEFT HAND - COMPLETE 3+ VIEW COMPARISON:  None. FINDINGS: Frontal, oblique, and lateral views obtained. There are tiny calcifications dorsal to the triquetrum bone which may have arthropathic etiology or possibly could represent small avulsions. No other evidence of potential fracture. No dislocation. Joint spaces appear normal. No erosive change. No radiopaque foreign body. IMPRESSION: Tiny calcifications dorsal to the triquetrum bone which may represent foci of arthropathic change or tiny avulsions. Clinical assessment of this area of wrist advised in this regard. No other evident potential fracture. No dislocation. No appreciable joint space narrowing or radiopaque foreign body. These results will be called to the ordering clinician or representative by the Radiologist Assistant, and communication documented in the PACS or zVision  Dashboard. Electronically Signed   By: Bretta Bang III M.D.   On: 09/20/2017 15:51   ____________________________________________   PROCEDURES Procedures   Left wrist placed in a volar dorsal OCL splint and sling given.  INITIAL IMPRESSION / ASSESSMENT AND PLAN / ED COURSE  Pertinent labs & imaging results that were available during my care of the patient were reviewed by me and considered in my medical decision making (see chart for details).  Well-appearing patient.  No acute distress.  Presenting for evaluation of left wrist pain post mechanical injury that occurred earlier today.  X-ray results as above per radiologist.  Per radiologist, concern for avulsion fracture to the triquetrium bone, patient is point tender in this area with swelling, suspect avulsion fracture.  Discussed in detail with patient and reviewed x-rays.  Splint applied, instructed to ice, elevate and supportive care.  Sling given.  Follow-up with orthopedic this week.  Patient states that she will take over-the-counter ibuprofen as needed for pain.  Quantity 4 Percocet given as needed for breakthrough pain, and patient states that she does not any more than this.  [place] controlled substance database reviewed and no recent controlled substances documented. Discussed indication, risks and benefits of medications with patient.  Discussed follow up with Primary care physician this week. Discussed follow up and return parameters including no resolution or any worsening concerns. Patient verbalized understanding and agreed to plan.   ____________________________________________   FINAL CLINICAL IMPRESSION(S) / ED DIAGNOSES  Final diagnoses:  Nondisplaced fracture of triquetrum (cuneiform) bone, left wrist, initial encounter for closed fracture  Left wrist pain     ED Discharge Orders        Ordered    oxyCODONE-acetaminophen (PERCOCET/ROXICET) 5-325 MG tablet  Every 8 hours PRN     09/20/17 1644        Note: This dictation was prepared with Dragon dictation along with smaller phrase technology. Any transcriptional errors that result from this process are unintentional.         Renford DillsMiller, Krishav Mamone, NP 09/20/17 1712

## 2017-09-20 NOTE — ED Notes (Signed)
Dorsal volar fiberglass splint applied to left wrist. PMS intact post application. Sling applied.

## 2017-09-29 DIAGNOSIS — R200 Anesthesia of skin: Secondary | ICD-10-CM | POA: Diagnosis not present

## 2017-09-29 DIAGNOSIS — S60212A Contusion of left wrist, initial encounter: Secondary | ICD-10-CM | POA: Diagnosis not present

## 2017-09-29 DIAGNOSIS — R202 Paresthesia of skin: Secondary | ICD-10-CM | POA: Diagnosis not present

## 2017-10-13 DIAGNOSIS — L700 Acne vulgaris: Secondary | ICD-10-CM | POA: Diagnosis not present

## 2017-10-13 DIAGNOSIS — Z79899 Other long term (current) drug therapy: Secondary | ICD-10-CM | POA: Diagnosis not present

## 2017-11-17 DIAGNOSIS — Z79899 Other long term (current) drug therapy: Secondary | ICD-10-CM | POA: Diagnosis not present

## 2017-11-17 DIAGNOSIS — L700 Acne vulgaris: Secondary | ICD-10-CM | POA: Diagnosis not present

## 2017-12-08 ENCOUNTER — Encounter: Payer: Self-pay | Admitting: Nurse Practitioner

## 2017-12-08 ENCOUNTER — Ambulatory Visit (INDEPENDENT_AMBULATORY_CARE_PROVIDER_SITE_OTHER): Payer: Federal, State, Local not specified - PPO | Admitting: Nurse Practitioner

## 2017-12-08 VITALS — BP 116/59 | HR 67 | Resp 16 | Ht 68.0 in | Wt 194.0 lb

## 2017-12-08 DIAGNOSIS — B3731 Acute candidiasis of vulva and vagina: Secondary | ICD-10-CM

## 2017-12-08 DIAGNOSIS — B373 Candidiasis of vulva and vagina: Secondary | ICD-10-CM

## 2017-12-08 MED ORDER — FLUCONAZOLE 150 MG PO TABS: 150.0000 mg | ORAL_TABLET | Freq: Once | ORAL | 1 refills | Status: AC

## 2017-12-08 NOTE — Progress Notes (Signed)
Subjective:    Patient ID: Erica Dodson, female    DOB: 10-30-1995, 22 y.o.   MRN: [id number]  Erica Dodson is a 22 y.o. female presenting on 12/08/2017 for Vaginitis (Has possible yeast inf and tried OTC meds that she feels did not help. )   HPI Vaginitis Patient presents for similar vaginitis complaints that have been treated on 10/22, 1/3, and 1/31.  These were found to be candidiasis and BV at different encounters. Since that time, she has remained largely asymptomatic.   - For current episode, she has used monistat like medication without improvement.  Is also having some bloating. She has started using a new product,  Summer's Eve, for perineal cleansing.   - No new sexual partners.  Social History   Tobacco Use  . Smoking status: Never Smoker  . Smokeless tobacco: Never Used  Substance Use Topics  . Alcohol use: Yes    Alcohol/week: 0.0 oz    Comment: occasional  . Drug use: No    Review of Systems Per HPI unless specifically indicated above     Objective:    BP (!) 116/59   Pulse 67   Resp 16   Ht 5\' 8"  (1.727 m)   Wt 194 lb (88 kg)   LMP 11/17/2017   SpO2 100%   BMI 29.50 kg/m   Wt Readings from Last 3 Encounters:  12/08/17 194 lb (88 kg)  09/20/17 186 lb (84.4 kg)  07/09/17 186 lb 3.2 oz (84.5 kg)    Physical Exam  Constitutional: She is oriented to person, place, and time. She appears well-developed and well-nourished. No distress.  HENT:  Head: Normocephalic and atraumatic.  Cardiovascular: Normal rate, regular rhythm, S1 normal, S2 normal, normal heart sounds and intact distal pulses.  Pulmonary/Chest: Effort normal and breath sounds normal. No respiratory distress.  Genitourinary:  Genitourinary Comments: Normal external female genitalia without lesions or fusion. Thick, white discharge on exam without odor. Bimanual exam without adnexal masses, enlarged uterus, or cervical motion tenderness.  Neurological: She is alert and oriented to person, place,  and time.  Skin: Skin is warm and dry.  Psychiatric: She has a normal mood and affect. Her behavior is normal.  Vitals reviewed.   Results for orders placed or performed in visit on 07/09/17  POCT Wet Prep [facility])  Result Value Ref Range   Source Wet Prep POC vaginal    WBC, Wet Prep HPF POC few    Bacteria Wet Prep HPF POC Moderate (A) Few   BACTERIA WET PREP MORPHOLOGY POC     Clue Cells Wet Prep HPF POC Moderate (A) None   Clue Cells Wet Prep Whiff POC Negative Whiff    Yeast Wet Prep HPF POC Moderate    KOH Wet Prep POC     Trichomonas Wet Prep HPF POC Absent Absent      Assessment & Plan:   Problem List Items Addressed This Visit    None    Visit Diagnoses    Yeast vaginitis    -  Primary    Clinically consistent with vaginal discharge on exam. Confirmed with candidiasis on wet prep with yeast buds and hyphae.  No clue cells and negative whiff test.  Plan: 1. Start Diflucan 150 mg tab x 1 dose.  May repeat in 3 days if no improvement. 2. Counseled on reducing recurrences with condom use, may try yogurt, probiotics, alternatively some patients are prone to recurrences with certain partners. 3. Addtionally, recommend patient  avoid using Summer's eve as this can change vaginal pH and make BV/candidiasis more likely. 4. Followup prn.   Meds ordered this encounter  Medications  . fluconazole (DIFLUCAN) 150 MG tablet    Sig: Take 1 tablet (150 mg total) by mouth once for 1 dose. Repeat in 3 days if not improved.    Dispense:  1 tablet    Refill:  1    Order Specific Question:   Supervising Provider    Answer:   Smitty CordsKARAMALEGOS, ALEXANDER J [[id number]]    Follow up plan: Return 4 weeks if symptoms worsen or fail to improve.  Erica McardleLauren Yolunda Kloos, DNP, AGPCNP-BC Adult Gerontology Primary Care Nurse Practitioner [facility] 12/08/2017, 4:43 PM

## 2017-12-08 NOTE — Patient Instructions (Addendum)
Erica Dodson,   Thank you for coming in to clinic today.  1. Yeast infection:  - Start diflucan - take one pill once today.  Repeat in 3 days if needed.  2. Anxiety - Mindfulness - great resources online and for apps that help focus on the now - 4-7-8 breathing technique: breathe in to count of 4, hold breath for count of 7, exhale for count of 8; do 3-5 times for letting go of overactive thoughts - Self - talk activities to reframe situations.  Please schedule a follow-up appointment with Cassell Smiles, AGNP. Return 4 weeks if symptoms worsen or fail to improve.  If you have any other questions or concerns, please feel free to call the clinic or send a message through [place]. You may also schedule an earlier appointment if necessary.  You will receive a survey after today's visit either digitally by e-mail or paper by [employer]. Your experiences and feedback matter to [place].  Please respond so we know how we are doing as we provide care for you.   Cassell Smiles, DNP, AGNP-BC Adult Gerontology Nurse Practitioner [facility], [facility]   Stress and Stress Management Stress is a normal reaction to life events. It is what you feel when life demands more than you are used to or more than you can handle. Some stress can be useful. For example, the stress reaction can help you catch the last bus of the day, study for a test, or meet a deadline at work. But stress that occurs too often or for too long can cause problems. It can affect your emotional health and interfere with relationships and normal daily activities. Too much stress can weaken your immune system and increase your risk for physical illness. If you already have a medical problem, stress can make it worse. What are the causes? All sorts of life events may cause stress. An event that causes stress for one person may not be stressful for another person. Major life events commonly cause stress. These may be positive or  negative. Examples include losing your job, moving into a new home, getting married, having a baby, or losing a loved one. Less obvious life events may also cause stress, especially if they occur day after day or in combination. Examples include working long hours, driving in traffic, caring for children, being in debt, or being in a difficult relationship. What are the signs or symptoms? Stress may cause emotional symptoms including, the following:  Anxiety. This is feeling worried, afraid, on edge, overwhelmed, or out of control.  Anger. This is feeling irritated or impatient.  Depression. This is feeling sad, down, helpless, or guilty.  Difficulty focusing, remembering, or making decisions.  Stress may cause physical symptoms, including the following:  Aches and pains. These may affect your head, neck, back, stomach, or other areas of your body.  Tight muscles or clenched jaw.  Low energy or trouble sleeping.  Stress may cause unhealthy behaviors, including the following:  Eating to feel better (overeating) or skipping meals.  Sleeping too little, too much, or both.  Working too much or putting off tasks (procrastination).  Smoking, drinking alcohol, or using drugs to feel better.  How is this diagnosed? Stress is diagnosed through an assessment by your health care provider. Your health care provider will ask questions about your symptoms and any stressful life events.Your health care provider will also ask about your medical history and may order blood tests or other tests. Certain medical  conditions and medicine can cause physical symptoms similar to stress. Mental illness can cause emotional symptoms and unhealthy behaviors similar to stress. Your health care provider may refer you to a mental health professional for further evaluation. How is this treated? Stress management is the recommended treatment for stress.The goals of stress management are reducing stressful life  events and coping with stress in healthy ways. Techniques for reducing stressful life events include the following:  Stress identification. Self-monitor for stress and identify what causes stress for you. These skills may help you to avoid some stressful events.  Time management. Set your priorities, keep a calendar of events, and learn to say "no." These tools can help you avoid making too many commitments.  Techniques for coping with stress include the following:  Rethinking the problem. Try to think realistically about stressful events rather than ignoring them or overreacting. Try to find the positives in a stressful situation rather than focusing on the negatives.  Exercise. Physical exercise can release both physical and emotional tension. The key is to find a form of exercise you enjoy and do it regularly.  Relaxation techniques. These relax the body and mind. Examples include yoga, meditation, tai chi, biofeedback, deep breathing, progressive muscle relaxation, listening to music, being out in nature, journaling, and other hobbies. Again, the key is to find one or more that you enjoy and can do regularly.  Healthy lifestyle. Eat a balanced diet, get plenty of sleep, and do not smoke. Avoid using alcohol or drugs to relax.  Strong support network. Spend time with family, friends, or other people you enjoy being around.Express your feelings and talk things over with someone you trust.  Counseling or talktherapy with a mental health professional may be helpful if you are having difficulty managing stress on your own. Medicine is typically not recommended for the treatment of stress.Talk to your health care provider if you think you need medicine for symptoms of stress. Follow these instructions at home:  Keep all follow-up visits as directed by your health care provider.  Take all medicines as directed by your health care provider. Contact a health care provider if:  Your symptoms  get worse or you start having new symptoms.  You feel overwhelmed by your problems and can no longer manage them on your own. Get help right away if:  You feel like hurting yourself or someone else. This information is not intended to replace advice given to you by your health care provider. Make sure you discuss any questions you have with your health care provider. Document Released: 11/19/2000 Document Revised: 11/01/2015 Document Reviewed: 01/18/2013 Elsevier Interactive Patient Education  2017 [employer].

## 2017-12-14 ENCOUNTER — Telehealth: Payer: Self-pay | Admitting: Student

## 2017-12-14 DIAGNOSIS — B9689 Other specified bacterial agents as the cause of diseases classified elsewhere: Secondary | ICD-10-CM

## 2017-12-14 DIAGNOSIS — N760 Acute vaginitis: Principal | ICD-10-CM

## 2017-12-14 MED ORDER — METRONIDAZOLE 500 MG PO TABS: 500.0000 mg | ORAL_TABLET | Freq: Two times a day (BID) | ORAL | 0 refills | Status: AC

## 2017-12-14 NOTE — Telephone Encounter (Signed)
Pt was in last week and was prescribed something yeast infection and now she is having symptoms of BV [phone]

## 2017-12-14 NOTE — Telephone Encounter (Signed)
Metronidazole 500 mg bid x 5 days sent to pharmacy.    - Patient may also benefit from boric acid suppositories in future to prevent BV.  These can be purchased over the counter by brand name ph[facility]D at any [facility] or [facility].  Use one capsule every 4-7 days to prevent development of BV and / or yeast infections.

## 2017-12-14 NOTE — Telephone Encounter (Signed)
Attempted to contact the pt, no answer. LMOM to return my call.  

## 2017-12-15 ENCOUNTER — Telehealth: Payer: Self-pay

## 2017-12-15 DIAGNOSIS — L700 Acne vulgaris: Secondary | ICD-10-CM | POA: Diagnosis not present

## 2017-12-15 DIAGNOSIS — L910 Hypertrophic scar: Secondary | ICD-10-CM | POA: Diagnosis not present

## 2017-12-15 DIAGNOSIS — Z79899 Other long term (current) drug therapy: Secondary | ICD-10-CM | POA: Diagnosis not present

## 2017-12-15 NOTE — Telephone Encounter (Signed)
The pt was notified. No questions or concerns. 

## 2017-12-15 NOTE — Telephone Encounter (Signed)
Pt notified. No questions or concerns.  

## 2018-01-26 DIAGNOSIS — L700 Acne vulgaris: Secondary | ICD-10-CM | POA: Diagnosis not present

## 2018-01-26 DIAGNOSIS — Z79899 Other long term (current) drug therapy: Secondary | ICD-10-CM | POA: Diagnosis not present

## 2018-02-23 ENCOUNTER — Ambulatory Visit (INDEPENDENT_AMBULATORY_CARE_PROVIDER_SITE_OTHER): Payer: Federal, State, Local not specified - PPO | Admitting: Nurse Practitioner

## 2018-02-23 ENCOUNTER — Encounter: Payer: Self-pay | Admitting: Nurse Practitioner

## 2018-02-23 ENCOUNTER — Other Ambulatory Visit: Payer: Self-pay

## 2018-02-23 ENCOUNTER — Other Ambulatory Visit (HOSPITAL_COMMUNITY)
Admission: RE | Admit: 2018-02-23 | Discharge: 2018-02-23 | Disposition: A | Discharge status: Home / Self Care | Payer: Federal, State, Local not specified - PPO | Source: Ambulatory Visit | Attending: Nurse Practitioner | Admitting: Nurse Practitioner

## 2018-02-23 VITALS — BP 104/56 | HR 69 | Temp 98.8°F | Ht 68.0 in | Wt 189.6 lb

## 2018-02-23 DIAGNOSIS — Z7689 Persons encountering health services in other specified circumstances: Secondary | ICD-10-CM | POA: Diagnosis not present

## 2018-02-23 DIAGNOSIS — Z3040 Encounter for surveillance of contraceptives, unspecified: Secondary | ICD-10-CM

## 2018-02-23 DIAGNOSIS — Z113 Encounter for screening for infections with a predominantly sexual mode of transmission: Secondary | ICD-10-CM

## 2018-02-23 DIAGNOSIS — R1030 Lower abdominal pain, unspecified: Secondary | ICD-10-CM

## 2018-02-23 MED ORDER — NORGESTIM-ETH ESTRAD TRIPHASIC 0.18/0.215/0.25 MG-35 MCG PO TABS: 1.0000 | ORAL_TABLET | Freq: Every day | ORAL | 12 refills | Status: DC

## 2018-02-23 NOTE — Patient Instructions (Addendum)
Erica Dodson,   Thank you for coming in to clinic today.  1. Your abdominal pain and cramping is most likely related to your GI symptoms.    2. We are doing STD testing today to rule that out.  Normal vaginal discharge on exam.    Please schedule a follow-up appointment with Erica Dodson, AGNP. Return 5-7 days if symptoms worsen or fail to improve.  If you have any other questions or concerns, please feel free to call the clinic or send a message through MyChart. You may also schedule an earlier appointment if necessary.  You will receive a survey after today's visit either digitally by e-mail or paper by [employer] mail. Your experiences and feedback matter to us.  Please respond so we know how we are doing as we provide care for you.   Erica McardleLauren Karne Ozga, Erica Dodson, Erica Dodson Erica Dodson, Erica Dodson

## 2018-02-23 NOTE — Progress Notes (Signed)
Subjective:    Patient ID: Erica Dodson, female    DOB: 06/10/1995, 22 y.o.   MRN: [id number]  Erica BassetMegan Rugg is a 22 y.o. female presenting on 02/23/2018 for Vaginitis ( abdominal cramp x 3 days , no vaginal discharge or vaginal odor.)   HPI Abdominal pain Abdominal cramping.  Is also "gassy" eating spicy foods.  No constipation/diarrhea.  New sexual partner No vaginal/genital itching.  Scheduled PAP smear.  New sexual partner over last month.  Would like STD screening. - Patient is concerned for BV or STI.  She is using boric acid suppositories once weekly and has had no recurrent BV discharge (thin, white, odorous) since that time.  Social History   Tobacco Use  . Smoking status: Never Smoker  . Smokeless tobacco: Never Used  Substance Use Topics  . Alcohol use: Yes    Alcohol/week: 0.0 standard drinks    Comment: occasional  . Drug use: No    Review of Systems Per HPI unless specifically indicated above     Objective:    BP (!) 104/56 (BP Location: Right Arm, Patient Position: Sitting, Cuff Size: Normal)   Pulse 69   Temp 98.8 F (37.1 C) (Oral)   Ht 5\' 8"  (1.727 m)   Wt 189 lb 9.6 oz (86 kg)   LMP 02/09/2018   BMI 28.83 kg/m   Wt Readings from Last 3 Encounters:  02/23/18 189 lb 9.6 oz (86 kg)  12/08/17 194 lb (88 kg)  09/20/17 186 lb (84.4 kg)    Physical Exam  Constitutional: She is oriented to person, place, and time. She appears well-developed and well-nourished. No distress.  HENT:  Head: Normocephalic and atraumatic.  Cardiovascular: Normal rate, regular rhythm, S1 normal, S2 normal, normal heart sounds and intact distal pulses.  Pulmonary/Chest: Effort normal and breath sounds normal. No respiratory distress.  Abdominal: Soft. Bowel sounds are normal. She exhibits no distension. There is no hepatosplenomegaly. There is tenderness (generalized lower abdomen). No hernia.  Genitourinary:  Genitourinary Comments: Normal external female genitalia without  lesions or fusion. Vaginal canal without lesions. Normal appearing cervix without lesions or friability. Physiologic discharge on exam. Bimanual exam without adnexal masses, enlarged uterus, or cervical motion tenderness.  Neurological: She is alert and oriented to person, place, and time.  Skin: Skin is warm and dry.  Psychiatric: She has a normal mood and affect. Her behavior is normal.  Vitals reviewed.  Results for orders placed or performed in visit on 07/09/17  POCT Wet Prep [facility]([place])  Result Value Ref Range   Source Wet Prep POC vaginal    WBC, Wet Prep HPF POC few    Bacteria Wet Prep HPF POC Moderate (A) Few   BACTERIA WET PREP MORPHOLOGY POC     Clue Cells Wet Prep HPF POC Moderate (A) None   Clue Cells Wet Prep Whiff POC Negative Whiff    Yeast Wet Prep HPF POC Moderate    KOH Wet Prep POC     Trichomonas Wet Prep HPF POC Absent Absent      Assessment & Plan:   Problem List Items Addressed This Visit    None    Visit Diagnoses    Screening examination for STD (sexually transmitted disease)    -  Primary   Relevant Orders   Cervicovaginal ancillary only (Completed)   HIV Antibody (routine testing w rflx)   RPR   Hepatitis C antibody   Encounter for refill of prescription for contraception  Relevant Medications   Norgestimate-Ethinyl Estradiol Triphasic (TRI-SPRINTEC) 0.18/0.215/0.25 MG-35 MCG tablet   Other Relevant Orders   POCT urine pregnancy    Patient desired to continue with STD screening.  Exam normal without signs and symptoms of STI.  Is found to have few clue cells.  Abdominal pain/cramping is likely gastroenteritis.  Plan: 1. STI testing with labs as ordered. 2. Counseled on safe sex practices. 3. Follow-up prn  Patient with stable use of COC pill.  Is happy with current method, but has been off her therapy. 1. Pregnancy today - not collected. 2. Continue OCP 3. Follow-up prn.   Meds ordered this encounter  Medications  .  Norgestimate-Ethinyl Estradiol Triphasic (TRI-SPRINTEC) 0.18/0.215/0.25 MG-35 MCG tablet    Sig: Take 1 tablet by mouth at bedtime.    Dispense:  1 Package    Refill:  12    Follow up plan: Return 5-7 days if symptoms worsen or fail to improve.  Wilhelmina Mcardle, DNP, AGPCNP-BC Adult Gerontology Primary Care Nurse Practitioner [facility] 02/23/2018, 10:25 AM

## 2018-02-24 LAB — CERVICOVAGINAL ANCILLARY ONLY
Chlamydia: NEGATIVE
Neisseria Gonorrhea: NEGATIVE
Trichomonas: NEGATIVE

## 2018-02-24 LAB — RPR: RPR Ser Ql: NONREACTIVE

## 2018-02-24 LAB — HEPATITIS C ANTIBODY
Hepatitis C Ab: NONREACTIVE
SIGNAL TO CUT-OFF: 0.02 (ref <1.00)

## 2018-02-24 LAB — HIV ANTIBODY (ROUTINE TESTING W REFLEX): HIV 1&2 Ab, 4th Generation: NONREACTIVE

## 2018-03-09 ENCOUNTER — Encounter: Payer: Self-pay | Admitting: Nurse Practitioner

## 2018-03-12 DIAGNOSIS — Z01419 Encounter for gynecological examination (general) (routine) without abnormal findings: Secondary | ICD-10-CM | POA: Diagnosis not present

## 2018-03-12 DIAGNOSIS — Z803 Family history of malignant neoplasm of breast: Secondary | ICD-10-CM | POA: Diagnosis not present

## 2018-03-16 DIAGNOSIS — N760 Acute vaginitis: Secondary | ICD-10-CM | POA: Diagnosis not present

## 2018-03-16 DIAGNOSIS — Z113 Encounter for screening for infections with a predominantly sexual mode of transmission: Secondary | ICD-10-CM | POA: Diagnosis not present

## 2018-04-30 ENCOUNTER — Encounter: Payer: Self-pay | Admitting: Nurse Practitioner

## 2018-05-20 DIAGNOSIS — L910 Hypertrophic scar: Secondary | ICD-10-CM | POA: Diagnosis not present

## 2018-05-20 DIAGNOSIS — D485 Neoplasm of uncertain behavior of skin: Secondary | ICD-10-CM | POA: Diagnosis not present

## 2018-05-20 DIAGNOSIS — D2339 Other benign neoplasm of skin of other parts of face: Secondary | ICD-10-CM | POA: Diagnosis not present

## 2018-07-25 DIAGNOSIS — N7689 Other specified inflammation of vagina and vulva: Secondary | ICD-10-CM | POA: Diagnosis not present

## 2018-11-30 DIAGNOSIS — Z113 Encounter for screening for infections with a predominantly sexual mode of transmission: Secondary | ICD-10-CM | POA: Diagnosis not present

## 2019-03-21 DIAGNOSIS — Z124 Encounter for screening for malignant neoplasm of cervix: Secondary | ICD-10-CM | POA: Diagnosis not present

## 2019-03-21 DIAGNOSIS — Z0000 Encounter for general adult medical examination without abnormal findings: Secondary | ICD-10-CM | POA: Diagnosis not present

## 2019-03-21 DIAGNOSIS — Z3041 Encounter for surveillance of contraceptive pills: Secondary | ICD-10-CM | POA: Diagnosis not present

## 2019-08-18 ENCOUNTER — Other Ambulatory Visit: Payer: Self-pay

## 2019-08-18 ENCOUNTER — Other Ambulatory Visit (HOSPITAL_COMMUNITY)
Admission: RE | Admit: 2019-08-18 | Discharge: 2019-08-18 | Disposition: A | Discharge status: Home / Self Care | Payer: BC Managed Care – PPO | Source: Ambulatory Visit | Attending: Family Medicine | Admitting: Family Medicine

## 2019-08-18 ENCOUNTER — Ambulatory Visit (INDEPENDENT_AMBULATORY_CARE_PROVIDER_SITE_OTHER): Payer: Federal, State, Local not specified - PPO | Admitting: Family Medicine

## 2019-08-18 ENCOUNTER — Encounter: Payer: Self-pay | Admitting: Family Medicine

## 2019-08-18 VITALS — BP 108/60 | HR 93 | Temp 97.7°F | Ht 68.0 in | Wt 168.6 lb

## 2019-08-18 DIAGNOSIS — N760 Acute vaginitis: Secondary | ICD-10-CM | POA: Insufficient documentation

## 2019-08-18 LAB — POCT URINALYSIS DIPSTICK
Bilirubin, UA: NEGATIVE
Glucose, UA: NEGATIVE
Ketones, UA: NEGATIVE
Leukocytes, UA: NEGATIVE
Nitrite, UA: NEGATIVE
Protein, UA: NEGATIVE
Spec Grav, UA: 1.015 (ref 1.010–1.025)
Urobilinogen, UA: 0.2 E.U./dL
pH, UA: 5 (ref 5.0–8.0)

## 2019-08-18 MED ORDER — FLUCONAZOLE 150 MG PO TABS: ORAL_TABLET | ORAL | 0 refills | Status: DC

## 2019-08-18 NOTE — Patient Instructions (Addendum)
As we discussed, you have a yeast infection.  I have sent in a prescription for Diflucan to take 1 tablet now and to repeat dosing in 72 hours if you have continued symptoms.  We have sent your vaginal swab to the lab for processing and we will contact you once we receive the results.  Avoid the boric acid suppositories as they can be very acidic and lead to skin/vaginal irritation.  Can apply over the counter Vaseline or Aquaphor to the outer labia to help with irritation.  You will receive a survey after today's visit either digitally by e-mail or paper by [employer]. Your experiences and feedback matter to [place].  Please respond so we know how we are doing as we provide care for you.  Call us with any questions/concerns/needs.  It is my goal to be available to you for your health concerns.  Thanks for choosing me to be a partner in your healthcare needs!  Charlaine Dalton, FNP-C Family Nurse Practitioner [facility] Phone: [phone]

## 2019-08-18 NOTE — Progress Notes (Signed)
Subjective:    Patient ID: Erica Dodson, female    DOB: 05-Aug-1995, 24 y.o.   MRN: [id number]  Erica Dodson is a 24 y.o. female presenting on 08/18/2019 for Vaginal Discharge (whitish discharge x 1 week ago. She treated it with OTC yeast infection medication. Last night she used a boric acid suppositories and notice vaginal irritation this morning. she describe it as a feeling of  rawiness. The symptoms are improving, but not subsided.)   HPI  Erica Dodson presents to clinic today for concerns of vaginal discharge x 1 week that was treated with over the counter yeast infection insert x 1 day and subsequent use of a boric acid vaginal insert.  Has skin irritation, burning, and feels "raw".  States symptoms have improved some but have not subsided.  Denies concerns for sexually transmitted infections and states discharge has not had any odor.  Denies abdominal pain, n/v/d, vaginal bleeding, changes to menses, possibility of pregnancy.  Depression screen [facility] 2/9 08/18/2019 12/08/2017  Decreased Interest 0 0  Down, Depressed, Hopeless 0 0  PHQ - 2 Score 0 0    Social History   Tobacco Use  . Smoking status: Never Smoker  . Smokeless tobacco: Never Used  Substance Use Topics  . Alcohol use: Yes    Alcohol/week: 0.0 standard drinks    Comment: occasional  . Drug use: No    Review of Systems  Constitutional: Negative.   HENT: Negative.   Eyes: Negative.   Respiratory: Negative.   Cardiovascular: Negative.   Gastrointestinal: Negative.   Endocrine: Negative.   Genitourinary: Positive for vaginal discharge and vaginal pain. Negative for decreased urine volume, difficulty urinating, dyspareunia, dysuria, enuresis, flank pain, frequency, genital sores, hematuria, menstrual problem, pelvic pain, urgency and vaginal bleeding.  Musculoskeletal: Negative.   Skin: Negative.   Allergic/Immunologic: Negative.   Neurological: Negative.   Hematological: Negative.   Psychiatric/Behavioral:  Negative.    Per HPI unless specifically indicated above     Objective:    BP 108/60 (BP Location: Right Arm, Patient Position: Sitting, Cuff Size: Normal)   Pulse 93   Temp 97.7 F (36.5 C) (Temporal)   Ht 5\' 8"  (1.727 m)   Wt 168 lb 9.6 oz (76.5 kg)   LMP 08/08/2019   BMI 25.64 kg/m   Wt Readings from Last 3 Encounters:  08/18/19 168 lb 9.6 oz (76.5 kg)  02/23/18 189 lb 9.6 oz (86 kg)  12/08/17 194 lb (88 kg)    Physical Exam Vitals reviewed.  Constitutional:      General: She is not in acute distress.    Appearance: Normal appearance. She is normal weight. She is not ill-appearing or toxic-appearing.  HENT:     Head: Normocephalic.  Eyes:     General:        Right eye: No discharge.        Left eye: No discharge.     Extraocular Movements: Extraocular movements intact.     Conjunctiva/sclera: Conjunctivae normal.     Pupils: Pupils are equal, round, and reactive to light.  Cardiovascular:     Pulses:          Dorsalis pedis pulses are 2+ on the right side and 2+ on the left side.       Posterior tibial pulses are 2+ on the right side and 2+ on the left side.  Pulmonary:     Effort: Pulmonary effort is normal.  Abdominal:     Hernia: There  is no hernia in the left inguinal area or right inguinal area.  Genitourinary:    Pubic Area: Rash present. No pubic lice.      Labia:        Right: Rash present. No tenderness, lesion or injury.        Left: Rash present. No tenderness, lesion or injury.      Vagina: No foreign body. Vaginal discharge and erythema present. No tenderness or bleeding.     Cervix: Normal.     Comments: Contact dermatitis external vaginal area.  Internal exam with yeast like discharge Feet:     Right foot:     Skin integrity: Skin integrity normal.     Left foot:     Skin integrity: Skin integrity normal.  Lymphadenopathy:     Lower Body: No right inguinal adenopathy. No left inguinal adenopathy.  Skin:    General: Skin is warm and dry.      Capillary Refill: Capillary refill takes less than 2 seconds.  Neurological:     General: No focal deficit present.     Mental Status: She is alert and oriented to person, place, and time.     Cranial Nerves: No cranial nerve deficit.     Sensory: No sensory deficit.     Motor: No weakness.     Coordination: Coordination normal.     Gait: Gait normal.  Psychiatric:        Attention and Perception: Attention and perception normal.        Mood and Affect: Mood and affect normal.        Speech: Speech normal.        Behavior: Behavior normal. Behavior is cooperative.        Thought Content: Thought content normal.        Cognition and Memory: Cognition and memory normal.        Judgment: Judgment normal.     Results for orders placed or performed in visit on 08/18/19  POCT Urinalysis Dipstick  Result Value Ref Range   Color, UA yellow    Clarity, UA clear    Glucose, UA Negative Negative   Bilirubin, UA negative    Ketones, UA negative    Spec Grav, UA 1.015 1.010 - 1.025   Blood, UA trace    pH, UA 5.0 5.0 - 8.0   Protein, UA Negative Negative   Urobilinogen, UA 0.2 0.2 or 1.0 E.U./dL   Nitrite, UA negative    Leukocytes, UA Negative Negative   Appearance     Odor        Assessment & Plan:   Problem List Items Addressed This Visit    None    Visit Diagnoses    Acute vaginitis    -  Primary   Relevant Medications   fluconazole (DIFLUCAN) 150 MG tablet   Other Relevant Orders   POCT Urinalysis Dipstick (Completed)      Meds ordered this encounter  Medications  . fluconazole (DIFLUCAN) 150 MG tablet    Sig: To take 1 tablet now and to repeat in 72 hours with any continued symptoms    Dispense:  1 tablet    Refill:  0      Follow up plan: Return if symptoms worsen or fail to improve.   Harlin Rain, [place] Family Nurse Practitioner [facility] 08/18/2019, 12:07 PM

## 2019-08-18 NOTE — Assessment & Plan Note (Signed)
Acute vaginitis, likely yeast infection based on exam findings.  Vaginal swab sent to the lab for confirmation and additional testing.  Will treat with Diflucan 150mg  1 tablet now and repeat dosing in 72 hours if having continued symptoms.  Discussed if going to use an over the counter yeast infection medication to avoid the 1 day dosing and to look for the 3, 5 or 7 day dosing as they have stronger research behind them as being more effective.  Discussion about avoiding boric acid vaginal suppositories in the future.  To call with any worsening of symptoms or if the symptoms do not resolve with current treatment.

## 2019-08-19 ENCOUNTER — Encounter: Payer: Self-pay | Admitting: Family Medicine

## 2019-08-19 ENCOUNTER — Other Ambulatory Visit: Payer: Self-pay

## 2019-08-19 ENCOUNTER — Telehealth: Payer: Self-pay | Admitting: Family Medicine

## 2019-08-19 ENCOUNTER — Ambulatory Visit (INDEPENDENT_AMBULATORY_CARE_PROVIDER_SITE_OTHER): Payer: BC Managed Care – PPO | Admitting: Family Medicine

## 2019-08-19 VITALS — BP 113/77 | HR 84 | Temp 97.5°F | Ht 68.0 in | Wt 168.0 lb

## 2019-08-19 DIAGNOSIS — Z113 Encounter for screening for infections with a predominantly sexual mode of transmission: Secondary | ICD-10-CM

## 2019-08-19 DIAGNOSIS — R300 Dysuria: Secondary | ICD-10-CM | POA: Insufficient documentation

## 2019-08-19 DIAGNOSIS — L089 Local infection of the skin and subcutaneous tissue, unspecified: Secondary | ICD-10-CM | POA: Insufficient documentation

## 2019-08-19 DIAGNOSIS — N760 Acute vaginitis: Secondary | ICD-10-CM

## 2019-08-19 LAB — POCT URINALYSIS DIPSTICK
Bilirubin, UA: NEGATIVE
Glucose, UA: NEGATIVE
Ketones, UA: NEGATIVE
Nitrite, UA: NEGATIVE
Protein, UA: POSITIVE — AB
Spec Grav, UA: <=1.005 — AB (ref 1.010–1.025)
Urobilinogen, UA: 0.2 E.U./dL
pH, UA: 5 (ref 5.0–8.0)

## 2019-08-19 MED ORDER — FLUCONAZOLE 150 MG PO TABS: ORAL_TABLET | ORAL | 0 refills | Status: DC

## 2019-08-19 MED ORDER — NITROFURANTOIN MONOHYD MACRO 100 MG PO CAPS: 100.0000 mg | ORAL_CAPSULE | Freq: Two times a day (BID) | ORAL | 0 refills | Status: DC

## 2019-08-19 MED ORDER — PHENAZOPYRIDINE HCL 100 MG PO TABS: 100.0000 mg | ORAL_TABLET | Freq: Three times a day (TID) | ORAL | 0 refills | Status: AC | PRN

## 2019-08-19 MED ORDER — CEPHALEXIN 500 MG PO CAPS: 500.0000 mg | ORAL_CAPSULE | Freq: Four times a day (QID) | ORAL | 0 refills | Status: AC

## 2019-08-19 NOTE — Patient Instructions (Addendum)
As we discussed, you have a urinary tract infection based on in office lab evaluation.  I am sending your urine for culture for evaluation of what microorganism is growing and to ensure we have chosen the correct empirical treatment.  I have also sent the urine for gonorrhea and chlamydia testing.  Abstain from all sexual contact until you receive your lab results.  I have sent in a prescription for Cephalexin, to take 1 tablet 4x a day for the next 5 days.  I have also sent in a prescription for pyridium 100mg  tablets, to take 1 tablet up to 3x a day as needed for urinary pain.  The prescription for Cephalexin will cover the skin infection and the urinary tract infection.  Remember to always wipe front to back and empty your bladder pre- and post-intercourse to prevent UTIs.   Drink plenty of fluids.  I have sent in a prescription for Pyridium.   As we discussed. this will change the color of your urine to an orange/red, this is normal.   You may also experience some urinary dribbling, be sure to wear a pantyliner to protect your clothing from staining.  We will plan to see you back in clinic with any worsening of symptoms.  You will receive a survey after today's visit either digitally by e-mail or paper by . Your experiences and feedback matter to [employer].  Please respond so we know how we are doing as we provide care for you.  Call us with any questions/concerns/needs.  It is my goal to be available to you for your health concerns.  Thanks for choosing me to be a partner in your healthcare needs!  [place], FNP-C Family Nurse Practitioner [facility] Phone: [phone]

## 2019-08-19 NOTE — Telephone Encounter (Signed)
Pt  think she have a UTI she said  the pain was worse today wanted to know if you would call in an antibiotic.

## 2019-08-19 NOTE — Addendum Note (Signed)
Addended by: Lonna Cobb on: 08/19/2019 01:20 PM   Modules accepted: Orders

## 2019-08-19 NOTE — Assessment & Plan Note (Signed)
Ingrown hair noted to bottom of right breast, actively draining, approx 38mm without streaking.  Two areas of ingrown hair noted in vaginal area.  Ingrown hair in vaginal area tender to palpation without redness/drainage.  Likely related to shaving.  Discussed using clippers instead of razor for trimming of pubic hair if needed.  Plan: 1) Will cover with Cephalexin 500mg  1 tablet 4x a day for the next 5 days 2) Call with any worsening of symptoms, questions/concerns/needs

## 2019-08-19 NOTE — Telephone Encounter (Signed)
Saw patient in clinic today.  See 08/19/2019 office note.

## 2019-08-19 NOTE — Assessment & Plan Note (Signed)
Symptoms started less than 24 hours ago.  UA POCT and based on patient's clinical exam/presentation likely urinary tract infection.  Sending urine for culture and at patient request to screen for GC/CT as well.    Plan: 1) Take Cephalexin 500mg  1 tablet 4x a day for the next 5 days 2) Take Pyridium as directed 3) We will contact you once we receive your lab results 4) Abstain from all sexual contact until you receive your results

## 2019-08-19 NOTE — Progress Notes (Signed)
Subjective:    Patient ID: Erica Dodson, female    DOB: [birth year], 24 y.o.   MRN: [id number]  Erica Dodson is a 24 y.o. female presenting on 08/19/2019 for Dysuria (hematuria) and Cyst (cyst on right breast, 2 cyst on the vaginal area. )   HPI  Health Maintenance: Erica Dodson presents to clinic for worsening of symptoms from her 08/18/2019 visit.  Has started to have dysuria, urinary frequency, urgency, hesitancy with hematuria and two areas of concern for skin infection that were not present yesterday.  Denies fevers, abdominal pain, n/v/d.  Hasn't taken anything for her new symptoms.  Depression screen [facility] 2/9 08/18/2019 12/08/2017  Decreased Interest 0 0  Down, Depressed, Hopeless 0 0  PHQ - 2 Score 0 0    Social History   Tobacco Use  . Smoking status: Never Smoker  . Smokeless tobacco: Never Used  Substance Use Topics  . Alcohol use: Yes    Alcohol/week: 0.0 standard drinks    Comment: occasional  . Drug use: No    Review of Systems  Constitutional: Negative.   HENT: Negative.   Eyes: Negative.   Respiratory: Negative.   Cardiovascular: Negative.   Gastrointestinal: Negative.   Endocrine: Negative.   Genitourinary: Positive for dysuria, frequency, hematuria, urgency, vaginal discharge and vaginal pain. Negative for decreased urine volume, difficulty urinating, dyspareunia, enuresis, flank pain, genital sores, menstrual problem, pelvic pain and vaginal bleeding.  Musculoskeletal: Negative.   Skin: Positive for rash. Negative for color change, pallor and wound.  Allergic/Immunologic: Negative.   Neurological: Negative.   Hematological: Negative.   Psychiatric/Behavioral: Negative for agitation, behavioral problems, confusion, decreased concentration, dysphoric mood, hallucinations, self-injury, sleep disturbance and suicidal ideas. The patient is nervous/anxious. The patient is not hyperactive.    Per HPI unless specifically indicated above     Objective:    BP  113/77 (BP Location: Left Arm, Patient Position: Sitting, Cuff Size: Normal)   Pulse 84   Temp (!) 97.5 F (36.4 C) (Temporal)   Ht 5\' 8"  (1.727 m)   Wt 168 lb (76.2 kg)   LMP 08/08/2019   BMI 25.54 kg/m   Wt Readings from Last 3 Encounters:  08/19/19 168 lb (76.2 kg)  08/18/19 168 lb 9.6 oz (76.5 kg)  02/23/18 189 lb 9.6 oz (86 kg)    Physical Exam Vitals reviewed.  Constitutional:      General: She is not in acute distress.    Appearance: Normal appearance. She is well-groomed and normal weight. She is not ill-appearing or toxic-appearing.  HENT:     Head: Normocephalic.  Eyes:     General:        Right eye: No discharge.        Left eye: No discharge.     Extraocular Movements: Extraocular movements intact.     Conjunctiva/sclera: Conjunctivae normal.     Pupils: Pupils are equal, round, and reactive to light.  Pulmonary:     Effort: Pulmonary effort is normal.  Abdominal:     Tenderness: There is no right CVA tenderness or left CVA tenderness.  Genitourinary:    General: Normal vulva.     Pubic Area: Rash present.     Vagina: Vaginal discharge present.       Comments: Ingrown hair x 2 with tenderness to palpation.  See image.  Recently shaved area. Skin:    Capillary Refill: Capillary refill takes less than 2 seconds.     Findings: Rash present.  Neurological:  General: No focal deficit present.     Mental Status: She is alert and oriented to person, place, and time.     Cranial Nerves: No cranial nerve deficit.     Sensory: No sensory deficit.     Motor: No weakness.     Coordination: Coordination normal.     Gait: Gait normal.  Psychiatric:        Attention and Perception: Attention and perception normal.        Mood and Affect: Mood is anxious. Affect is tearful.        Speech: Speech normal.        Behavior: Behavior normal. Behavior is cooperative.        Thought Content: Thought content normal.        Cognition and Memory: Cognition and memory  normal.        Judgment: Judgment normal.     Results for orders placed or performed in visit on 08/19/19  POCT Urinalysis Dipstick  Result Value Ref Range   Color, UA Orange    Clarity, UA cloudy    Glucose, UA Negative Negative   Bilirubin, UA negative    Ketones, UA negative    Spec Grav, UA <=1.005 (A) 1.010 - 1.025   Blood, UA large    pH, UA 5.0 5.0 - 8.0   Protein, UA Positive (A) Negative   Urobilinogen, UA 0.2 0.2 or 1.0 E.U./dL   Nitrite, UA negative    Leukocytes, UA Large (3+) (A) Negative   Appearance     Odor        Assessment & Plan:   Problem List Items Addressed This Visit      Musculoskeletal and Integument   Skin infection    Ingrown hair noted to bottom of right breast, actively draining, approx 32mm without streaking.  Two areas of ingrown hair noted in vaginal area.  Ingrown hair in vaginal area tender to palpation without redness/drainage.  Likely related to shaving.  Discussed using clippers instead of razor for trimming of pubic hair if needed.  Plan: 1) Will cover with Cephalexin 500mg  1 tablet 4x a day for the next 5 days 2) Call with any worsening of symptoms, questions/concerns/needs      Relevant Medications   cephALEXin (KEFLEX) 500 MG capsule   fluconazole (DIFLUCAN) 150 MG tablet     Genitourinary   Acute vaginitis   Relevant Medications   fluconazole (DIFLUCAN) 150 MG tablet   Other Relevant Orders   N. gonorrhoea and Chlamydia by PCR (IPS)     Other   Dysuria - Primary    Symptoms started less than 24 hours ago.  UA POCT and based on patient's clinical exam/presentation likely urinary tract infection.  Sending urine for culture and at patient request to screen for GC/CT as well.    Plan: 1) Take Cephalexin 500mg  1 tablet 4x a day for the next 5 days 2) Take Pyridium as directed 3) We will contact you once we receive your lab results 4) Abstain from all sexual contact until you receive your results        Relevant  Medications   phenazopyridine (PYRIDIUM) 100 MG tablet   cephALEXin (KEFLEX) 500 MG capsule   Other Relevant Orders   POCT Urinalysis Dipstick (Completed)   Urine Culture   Cervicovaginal ancillary only   Urine Culture    Other Visit Diagnoses    Screening examination for sexually transmitted disease       Relevant Orders  Cervicovaginal ancillary only   N. gonorrhoea and Chlamydia by PCR (IPS)      Meds ordered this encounter  Medications  . DISCONTD: nitrofurantoin, macrocrystal-monohydrate, (MACROBID) 100 MG capsule    Sig: Take 1 capsule (100 mg total) by mouth 2 (two) times daily for 5 days.    Dispense:  10 capsule    Refill:  0  . phenazopyridine (PYRIDIUM) 100 MG tablet    Sig: Take 1 tablet (100 mg total) by mouth 3 (three) times daily as needed for up to 2 days for pain.    Dispense:  6 tablet    Refill:  0  . cephALEXin (KEFLEX) 500 MG capsule    Sig: Take 1 capsule (500 mg total) by mouth 4 (four) times daily for 5 days.    Dispense:  20 capsule    Refill:  0  . fluconazole (DIFLUCAN) 150 MG tablet    Sig: Take 1 tablet now and repeat dose in 72 hours if having continued symptoms.    Dispense:  2 tablet    Refill:  0      Follow up plan: Return if symptoms worsen or fail to improve.   Harlin Rain, [place] Family Nurse Practitioner [facility] 08/19/2019, 10:41 AM

## 2019-08-20 LAB — URINE CULTURE
MICRO NUMBER:: 10247254
SPECIMEN QUALITY:: ADEQUATE

## 2019-08-21 LAB — CHLAMYDIA/NEISSERIA GONORRHOEAE RNA,TMA,UROGENTIAL
C. trachomatis RNA, TMA: NOT DETECTED
N. gonorrhoeae RNA, TMA: NOT DETECTED

## 2019-08-22 ENCOUNTER — Other Ambulatory Visit: Payer: Self-pay | Admitting: Family Medicine

## 2019-08-22 DIAGNOSIS — N760 Acute vaginitis: Secondary | ICD-10-CM

## 2019-08-22 LAB — CERVICOVAGINAL ANCILLARY ONLY
Bacterial Vaginitis (gardnerella): NEGATIVE
Candida Glabrata: NEGATIVE
Candida Vaginitis: NEGATIVE
Comment: NEGATIVE
Comment: NEGATIVE
Comment: NEGATIVE

## 2019-08-22 MED ORDER — METRONIDAZOLE 500 MG PO TABS: 500.0000 mg | ORAL_TABLET | Freq: Two times a day (BID) | ORAL | 0 refills | Status: AC

## 2019-08-22 NOTE — Progress Notes (Signed)
Urine culture showing likely contamination.  GC/CT negative.

## 2019-08-22 NOTE — Progress Notes (Signed)
Telephone call with patient, discharge has changed and is white, stringy, with an odor, similar with her previous episodes of BV.  Will treat based on history.  Awaiting results of cervicovaginal swab sent.

## 2019-08-23 NOTE — Progress Notes (Signed)
Cervicovaginal swab negative.  Will continue to treat as bacterial vaginosis based on symptoms and clinical presentation while in clinic.

## 2019-09-21 NOTE — Telephone Encounter (Signed)
Close encounter 

## 2019-11-08 DIAGNOSIS — L821 Other seborrheic keratosis: Secondary | ICD-10-CM | POA: Diagnosis not present

## 2019-11-08 DIAGNOSIS — L910 Hypertrophic scar: Secondary | ICD-10-CM | POA: Diagnosis not present

## 2020-01-11 ENCOUNTER — Telehealth: Payer: Self-pay

## 2020-01-11 NOTE — Telephone Encounter (Signed)
Open in error

## 2020-03-07 ENCOUNTER — Other Ambulatory Visit: Payer: Self-pay | Admitting: Family Medicine

## 2020-03-07 ENCOUNTER — Encounter: Payer: Self-pay | Admitting: Family Medicine

## 2020-03-07 DIAGNOSIS — Z3040 Encounter for surveillance of contraceptives, unspecified: Secondary | ICD-10-CM

## 2020-03-07 MED ORDER — NORGESTIM-ETH ESTRAD TRIPHASIC 0.18/0.215/0.25 MG-35 MCG PO TABS: 1.0000 | ORAL_TABLET | Freq: Every day | ORAL | 0 refills | Status: DC

## 2020-03-13 ENCOUNTER — Encounter: Payer: BC Managed Care – PPO | Admitting: Family Medicine

## 2020-03-19 ENCOUNTER — Other Ambulatory Visit (HOSPITAL_COMMUNITY)
Admission: RE | Admit: 2020-03-19 | Discharge: 2020-03-19 | Disposition: A | Discharge status: Home / Self Care | Payer: BC Managed Care – PPO | Source: Ambulatory Visit | Attending: Family Medicine | Admitting: Family Medicine

## 2020-03-19 ENCOUNTER — Ambulatory Visit (INDEPENDENT_AMBULATORY_CARE_PROVIDER_SITE_OTHER): Payer: BC Managed Care – PPO | Admitting: Family Medicine

## 2020-03-19 ENCOUNTER — Encounter: Payer: Self-pay | Admitting: Family Medicine

## 2020-03-19 ENCOUNTER — Other Ambulatory Visit: Payer: Self-pay

## 2020-03-19 VITALS — BP 111/62 | HR 68 | Temp 97.8°F | Resp 20 | Ht 68.0 in | Wt 164.8 lb

## 2020-03-19 DIAGNOSIS — R635 Abnormal weight gain: Secondary | ICD-10-CM | POA: Diagnosis not present

## 2020-03-19 DIAGNOSIS — Z0000 Encounter for general adult medical examination without abnormal findings: Secondary | ICD-10-CM | POA: Diagnosis not present

## 2020-03-19 DIAGNOSIS — Z124 Encounter for screening for malignant neoplasm of cervix: Secondary | ICD-10-CM | POA: Insufficient documentation

## 2020-03-19 DIAGNOSIS — Z79899 Other long term (current) drug therapy: Secondary | ICD-10-CM

## 2020-03-19 DIAGNOSIS — Z3040 Encounter for surveillance of contraceptives, unspecified: Secondary | ICD-10-CM | POA: Diagnosis not present

## 2020-03-19 MED ORDER — NORGESTIM-ETH ESTRAD TRIPHASIC 0.18/0.215/0.25 MG-35 MCG PO TABS: 1.0000 | ORAL_TABLET | Freq: Every day | ORAL | 11 refills | Status: DC

## 2020-03-19 NOTE — Assessment & Plan Note (Signed)
Last PAP testing unknown.  No previous results on file.  Plan: 1. PAP testing completed and sent to lab for evaluation

## 2020-03-19 NOTE — Assessment & Plan Note (Signed)
Annual physical exam without new findings.  Well adult with no acute concerns.  Plan: 1. Obtain health maintenance screenings as above according to age. - Increase physical activity to 30 minutes most days of the week.  - Eat healthy diet high in vegetables and fruits; low in refined carbohydrates. - Screening labs and tests as ordered 2. Return 1 year for annual physical.  

## 2020-03-19 NOTE — Patient Instructions (Signed)
We have sent your PAP to the lab for testing.  Will contact you once the results are received.  Well Visit: Care Instructions Overview  Well visits can help you stay healthy. Your provider has checked your overall health and may have suggested ways to take good care of yourself. Your provider also may have recommended tests. At home, you can help prevent illness with healthy eating, regular exercise, and other steps.  Follow-up care is a key part of your treatment and safety. Be sure to make and go to all appointments, and call your provider if you are having problems. It's also a good idea to know your test results and keep a list of the medicines you take.  How can you care for yourself at home?   Get screening tests that you and your doctor decide on. Screening helps find diseases before any symptoms appear.   Eat healthy foods. Choose fruits, vegetables, whole grains, protein, and low-fat dairy foods. Limit fat, especially saturated fat. Reduce salt in your diet.   Limit alcohol. If you are a man, have no more than 2 drinks a day or 14 drinks a week. If you are a woman, have no more than 1 drink a day or 7 drinks a week.   Get at least 30 minutes of physical activity on most days of the week.  We recommend you go no more than 2 days in a row without exercise. Walking is a good choice. You also may want to do other activities, such as running, swimming, cycling, or playing tennis or team sports. Discuss any changes in your exercise program with your provider.   Reach and stay at a healthy weight. This will lower your risk for many problems, such as obesity, diabetes, heart disease, and high blood pressure.   Do not smoke or allow others to smoke around you. If you need help quitting, talk to your provider about stop-smoking programs and medicines. These can increase your chances of quitting for good.  Can call 1-800-QUIT-NOW (1-800-784-8669) for the [place] Quitline, assistance  with smoking cessation.   Care for your mental health. It is easy to get weighed down by worry and stress. Learn strategies to manage stress, like deep breathing and mindfulness, and stay connected with your family and community. If you find you often feel sad or hopeless, talk with your provider. Treatment can help.   Talk to your provider about whether you have any risk factors for sexually transmitted infections (STIs). You can help prevent STIs if you wait to have sex with a new partner (or partners) until you've each been tested for STIs. It also helps if you use condoms (female or female condoms) and if you limit your sex partners to one person who only has sex with you. Vaccines are available for some STIs, such as HPV (these are age dependent).   Use birth control if it's important to you to prevent pregnancy. Talk with your provider about the choices available and what might be best for you.   If you think you may have a problem with alcohol or drug use, talk to your provider. This includes prescription medicines (such as amphetamines and opioids) and illegal drugs (such as cocaine and methamphetamine). Your provider can help you figure out what type of treatment is best for you.   If you have concerns about domestic violence or intimate partner violence, there are resources available to you. National Domestic Abuse Hotline 1-800-7233   Protect your skin   from too much sun. When you're outdoors from 10 a.m. to 4 p.m., stay in the shade or cover up with clothing and a hat with a wide brim. Wear sunglasses that block UV rays. Even when it's cloudy, put broad-spectrum sunscreen (SPF 30 or higher) on any exposed skin.   See a dentist one or two times a year for checkups and to have your teeth cleaned.   See an eye doctor once per year for an eye exam.   Wear a seat belt in the car.  When should you call for help?  Watch closely for changes in your health, and be sure to contact your  provider if you have any problems or symptoms that concern you.  We will plan to see you back in 12 months for your physical  You will receive a survey after today's visit either digitally by e-mail or paper by USPS mail. Your experiences and feedback matter to us.  Please respond so we know how we are doing as we provide care for you.  Call us with any questions/concerns/needs.  It is my goal to be available to you for your health concerns.  Thanks for choosing me to be a partner in your healthcare needs!  Erica Dodson Erica Dawnmarie Breon, FNP-C Family Nurse Practitioner [facility] Phone: [phone]  

## 2020-03-19 NOTE — Assessment & Plan Note (Signed)
Medication refill requested, will send to pharmacy on file.

## 2020-03-19 NOTE — Progress Notes (Signed)
Subjective:    Patient ID: Erica Dodson, female    DOB: May 12, 1996, 23 y.o.   MRN: [id number]  Erica Dodson is a 24 y.o. female presenting on 03/19/2020 for Annual Exam   HPI  HEALTH MAINTENANCE:  Weight/BMI: Overweight, BMI 25.06% Physical activity: Stays active Diet: Regular Seatbelt: Always Sunscreen: Always PAP: Due today, Completed HIV & Hep C Screening: Offered and declined GC/CT: Offered and declined Optometry: Once every few years, no concerns currently Dentistry: Every 6 months  IMMUNIZATIONS: Influenza: Declined Tetanus: Up to date, 11/27/2011 COVID: Up to date, [place] 01/09/2020, 01/30/2020  Depression screen [facility] 2/9 08/18/2019 12/08/2017  Decreased Interest 0 0  Down, Depressed, Hopeless 0 0  PHQ - 2 Score 0 0    History reviewed. No pertinent past medical history. History reviewed. No pertinent surgical history. Social History   Socioeconomic History  . Marital status: Single    Spouse name: Not on file  . Number of children: Not on file  . Years of education: Not on file  . Highest education level: Not on file  Occupational History  . Not on file  Tobacco Use  . Smoking status: Never Smoker  . Smokeless tobacco: Never Used  Vaping Use  . Vaping Use: Never used  Substance and Sexual Activity  . Alcohol use: Yes    Alcohol/week: 0.0 standard drinks    Comment: occasional  . Drug use: No  . Sexual activity: Not on file  Other Topics Concern  . Not on file  Social History Narrative  . Not on file   Social Determinants of Health   Financial Resource Strain:   . Difficulty of Paying Living Expenses: Not on file  Food Insecurity:   . Worried About Charity fundraiser in the Last Year: Not on file  . Ran Out of Food in the Last Year: Not on file  Transportation Needs:   . Lack of Transportation (Medical): Not on file  . Lack of Transportation (Non-Medical): Not on file  Physical Activity:   . Days of Exercise per Week: Not on file  .  Minutes of Exercise per Session: Not on file  Stress:   . Feeling of Stress : Not on file  Social Connections:   . Frequency of Communication with Friends and Family: Not on file  . Frequency of Social Gatherings with Friends and Family: Not on file  . Attends Religious Services: Not on file  . Active Member of Clubs or Organizations: Not on file  . Attends Archivist Meetings: Not on file  . Marital Status: Not on file  Intimate Partner Violence:   . Fear of Current or Ex-Partner: Not on file  . Emotionally Abused: Not on file  . Physically Abused: Not on file  . Sexually Abused: Not on file   Family History  Problem Relation Age of Onset  . Heart Problems Father   . Asthma Brother    No current outpatient medications on file prior to visit.   No current facility-administered medications on file prior to visit.    Per HPI unless specifically indicated above     Objective:    BP 111/62 (BP Location: Right Arm, Patient Position: Sitting, Cuff Size: Normal)   Pulse 68   Temp 97.8 F (36.6 C) (Oral)   Resp 20   Ht 5' 8"  (1.727 m)   Wt 164 lb 12.8 oz (74.8 kg)   LMP 03/06/2020   SpO2 100%   BMI 25.06  kg/m   Wt Readings from Last 3 Encounters:  03/19/20 164 lb 12.8 oz (74.8 kg)  08/19/19 168 lb (76.2 kg)  08/18/19 168 lb 9.6 oz (76.5 kg)    Physical Exam Vitals and nursing note reviewed.  Constitutional:      General: She is not in acute distress.    Appearance: Normal appearance. She is well-developed, well-groomed and overweight. She is not ill-appearing or toxic-appearing.  HENT:     Head: Normocephalic and atraumatic.     Right Ear: Tympanic membrane, ear canal and external ear normal. There is no impacted cerumen.     Left Ear: Tympanic membrane, ear canal and external ear normal. There is no impacted cerumen.     Nose: Nose normal. No congestion or rhinorrhea.     Comments: Lizbeth Bark is in place, covering mouth and nose.    Mouth/Throat:     Lips:  Pink.     Mouth: Mucous membranes are moist.     Pharynx: Oropharynx is clear. Uvula midline. No oropharyngeal exudate or posterior oropharyngeal erythema.  Eyes:     General: Lids are normal. Vision grossly intact. No scleral icterus.       Right eye: No discharge.        Left eye: No discharge.     Extraocular Movements: Extraocular movements intact.     Conjunctiva/sclera: Conjunctivae normal.     Pupils: Pupils are equal, round, and reactive to light.  Neck:     Thyroid: No thyroid mass or thyromegaly.  Cardiovascular:     Rate and Rhythm: Normal rate and regular rhythm.     Pulses: Normal pulses.          Dorsalis pedis pulses are 2+ on the right side and 2+ on the left side.     Heart sounds: Normal heart sounds. No murmur heard.  No friction rub. No gallop.   Pulmonary:     Effort: Pulmonary effort is normal. No respiratory distress.     Breath sounds: Normal breath sounds.  Abdominal:     General: Abdomen is flat. Bowel sounds are normal. There is no distension.     Palpations: Abdomen is soft. There is no hepatomegaly, splenomegaly or mass.     Tenderness: There is no abdominal tenderness. There is no guarding or rebound.     Hernia: No hernia is present. There is no hernia in the left inguinal area or right inguinal area.  Genitourinary:    General: Normal vulva.     Exam position: Lithotomy position.     Pubic Area: No rash or pubic lice.      Labia:        Right: No rash, tenderness, lesion or injury.        Left: No rash, tenderness, lesion or injury.      Urethra: No urethral pain, urethral swelling or urethral lesion.     Vagina: Normal. No signs of injury and foreign body. No vaginal discharge, erythema, tenderness, bleeding or lesions.     Cervix: Normal.     Uterus: Normal.      Adnexa: Right adnexa normal and left adnexa normal.  Musculoskeletal:        General: Normal range of motion.     Cervical back: Normal range of motion and neck supple. No tenderness.      Right lower leg: No edema.     Left lower leg: No edema.     Comments: Normal tone, strength 5/5 BUE & BLE  Feet:     Right foot:     Skin integrity: Skin integrity normal.     Left foot:     Skin integrity: Skin integrity normal.  Lymphadenopathy:     Cervical: No cervical adenopathy.     Lower Body: No right inguinal adenopathy. No left inguinal adenopathy.  Skin:    General: Skin is warm and dry.     Capillary Refill: Capillary refill takes less than 2 seconds.  Neurological:     General: No focal deficit present.     Mental Status: She is alert and oriented to person, place, and time.     Cranial Nerves: No cranial nerve deficit.     Sensory: No sensory deficit.     Motor: No weakness.     Coordination: Coordination normal.     Gait: Gait normal.     Deep Tendon Reflexes: Reflexes normal.  Psychiatric:        Attention and Perception: Attention and perception normal.        Mood and Affect: Mood and affect normal.        Speech: Speech normal.        Behavior: Behavior normal. Behavior is cooperative.        Thought Content: Thought content normal.        Cognition and Memory: Cognition and memory normal.        Judgment: Judgment normal.     Results for orders placed or performed in visit on 08/19/19  Urine Culture   Specimen: Urine  Result Value Ref Range   MICRO NUMBER: [id number]    SPECIMEN QUALITY: Adequate    Sample Source URINE    STATUS: FINAL    Result:      Growth of mixed flora was isolated, suggesting probable contamination. No further testing will be performed. If clinically indicated, recollection using a method to minimize contamination, with prompt transfer to Urine Culture Transport Tube, is  recommended.   Chlamydia/Neisseria Gonorrhoeae RNA,TMA,Urogenital  Result Value Ref Range   C. trachomatis RNA, TMA Not Detected Not Detect   N. gonorrhoeae RNA, TMA Not Detected Not Detect  POCT Urinalysis Dipstick  Result Value Ref Range   Color, UA  Orange    Clarity, UA cloudy    Glucose, UA Negative Negative   Bilirubin, UA negative    Ketones, UA negative    Spec Grav, UA <=1.005 (A) 1.010 - 1.025   Blood, UA large    pH, UA 5.0 5.0 - 8.0   Protein, UA Positive (A) Negative   Urobilinogen, UA 0.2 0.2 or 1.0 E.U./dL   Nitrite, UA negative    Leukocytes, UA Large (3+) (A) Negative   Appearance     Odor        Assessment & Plan:   Problem List Items Addressed This Visit      Other   Annual physical exam - Primary    Annual physical exam without new findings.  Well adult with no acute concerns.  Plan: 1. Obtain health maintenance screenings as above according to age. - Increase physical activity to 30 minutes most days of the week.  - Eat healthy diet high in vegetables and fruits; low in refined carbohydrates. - Screening labs and tests as ordered 2. Return 1 year for annual physical.       Relevant Orders   CBC with Differential   CMP14+EGFR   POCT Urinalysis Dipstick   Encounter for refill of prescription for contraception  Medication refill requested, will send to pharmacy on file.      Relevant Medications   Norgestimate-Ethinyl Estradiol Triphasic (TRI-SPRINTEC) 0.18/0.215/0.25 MG-35 MCG tablet   Screening for cervical cancer    Last PAP testing unknown.  No previous results on file.  Plan: 1. PAP testing completed and sent to lab for evaluation        Relevant Orders   Cytology - PAP    Other Visit Diagnoses    Weight gain       Relevant Orders   TSH + free T4   Long-term use of high-risk medication       Relevant Orders   Lipid Profile      Meds ordered this encounter  Medications  . Norgestimate-Ethinyl Estradiol Triphasic (TRI-SPRINTEC) 0.18/0.215/0.25 MG-35 MCG tablet    Sig: Take 1 tablet by mouth at bedtime.    Dispense:  28 tablet    Refill:  11   Follow up plan: Return in about 1 year (around 03/19/2021) for CPE.  Harlin Rain, FNP-C Family Nurse Practitioner [facility] 03/19/2020, 12:41 PM

## 2020-03-20 LAB — CYTOLOGY - PAP
Comment: NEGATIVE
Diagnosis: NEGATIVE
High risk HPV: NEGATIVE

## 2020-03-21 DIAGNOSIS — Z0000 Encounter for general adult medical examination without abnormal findings: Secondary | ICD-10-CM | POA: Diagnosis not present

## 2020-03-21 DIAGNOSIS — Z79899 Other long term (current) drug therapy: Secondary | ICD-10-CM | POA: Diagnosis not present

## 2020-03-21 DIAGNOSIS — R635 Abnormal weight gain: Secondary | ICD-10-CM | POA: Diagnosis not present

## 2020-03-22 LAB — TSH+FREE T4
Free T4: 1.23 ng/dL (ref 0.82–1.77)
TSH: 1.27 u[IU]/mL (ref 0.450–4.500)

## 2020-03-22 LAB — CMP14+EGFR
ALT: 14 IU/L (ref 0–32)
AST: 16 IU/L (ref 0–40)
Albumin/Globulin Ratio: 2 (ref 1.2–2.2)
Albumin: 4.6 g/dL (ref 3.9–5.0)
Alkaline Phosphatase: 75 IU/L (ref 44–121)
BUN/Creatinine Ratio: 14 (ref 9–23)
BUN: 12 mg/dL (ref 6–20)
Bilirubin Total: 0.2 mg/dL (ref 0.0–1.2)
CO2: 22 mmol/L (ref 20–29)
Calcium: 9.4 mg/dL (ref 8.7–10.2)
Chloride: 105 mmol/L (ref 96–106)
Creatinine, Ser: 0.85 mg/dL (ref 0.57–1.00)
GFR calc Af Amer: 109 mL/min/{1.73_m2} (ref >59)
GFR calc non Af Amer: 95 mL/min/{1.73_m2} (ref >59)
Globulin, Total: 2.3 g/dL (ref 1.5–4.5)
Glucose: 88 mg/dL (ref 65–99)
Potassium: 4.3 mmol/L (ref 3.5–5.2)
Sodium: 139 mmol/L (ref 134–144)
Total Protein: 6.9 g/dL (ref 6.0–8.5)

## 2020-03-22 LAB — CBC WITH DIFFERENTIAL/PLATELET
Basophils Absolute: 0 10*3/uL (ref 0.0–0.2)
Basos: 0 %
EOS (ABSOLUTE): 0.1 10*3/uL (ref 0.0–0.4)
Eos: 2 %
Hematocrit: 40.1 % (ref 34.0–46.6)
Hemoglobin: 13.8 g/dL (ref 11.1–15.9)
Immature Grans (Abs): 0 10*3/uL (ref 0.0–0.1)
Immature Granulocytes: 0 %
Lymphocytes Absolute: 2.8 10*3/uL (ref 0.7–3.1)
Lymphs: 39 %
MCH: 29.7 pg (ref 26.6–33.0)
MCHC: 34.4 g/dL (ref 31.5–35.7)
MCV: 86 fL (ref 79–97)
Monocytes Absolute: 0.8 10*3/uL (ref 0.1–0.9)
Monocytes: 11 %
Neutrophils Absolute: 3.4 10*3/uL (ref 1.4–7.0)
Neutrophils: 48 %
Platelets: 251 10*3/uL (ref 150–450)
RBC: 4.64 x10E6/uL (ref 3.77–5.28)
RDW: 12.3 % (ref 11.7–15.4)
WBC: 7.1 10*3/uL (ref 3.4–10.8)

## 2020-03-22 LAB — LIPID PANEL
Chol/HDL Ratio: 2.7 ratio (ref 0.0–4.4)
Cholesterol, Total: 130 mg/dL (ref 100–199)
HDL: 49 mg/dL (ref >39)
LDL Chol Calc (NIH): 68 mg/dL (ref 0–99)
Triglycerides: 60 mg/dL (ref 0–149)
VLDL Cholesterol Cal: 13 mg/dL (ref 5–40)

## 2020-03-30 IMAGING — CR DG WRIST COMPLETE 3+V*L*
4 series · 4 of 4 positions shown · non-contrast
Comparison: None

CLINICAL DATA: Hit wrist against Edison pole

EXAM:
LEFT WRIST - COMPLETE 3+ VIEW

[wrist pa]
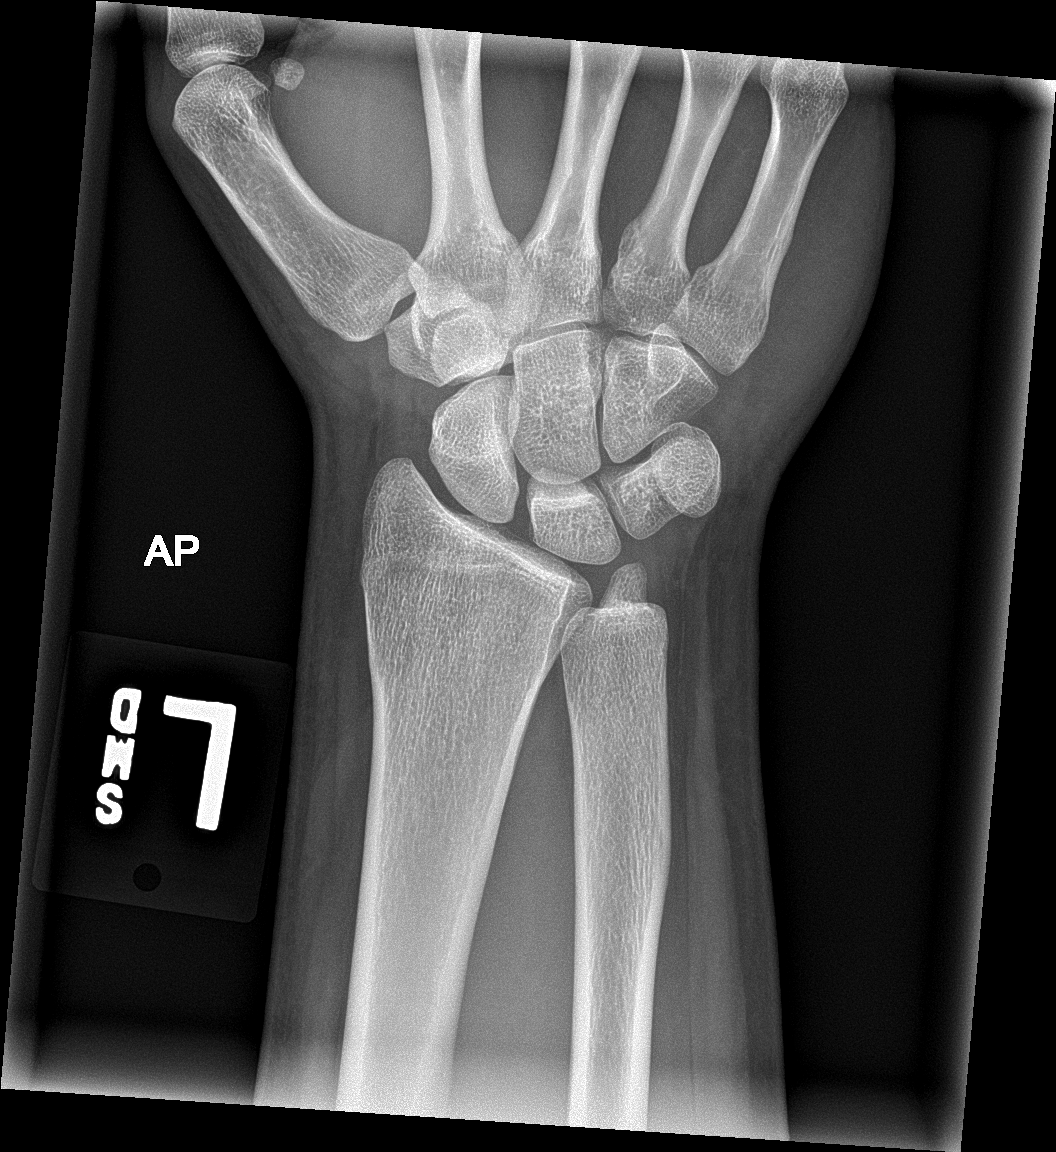

[wrist obl]
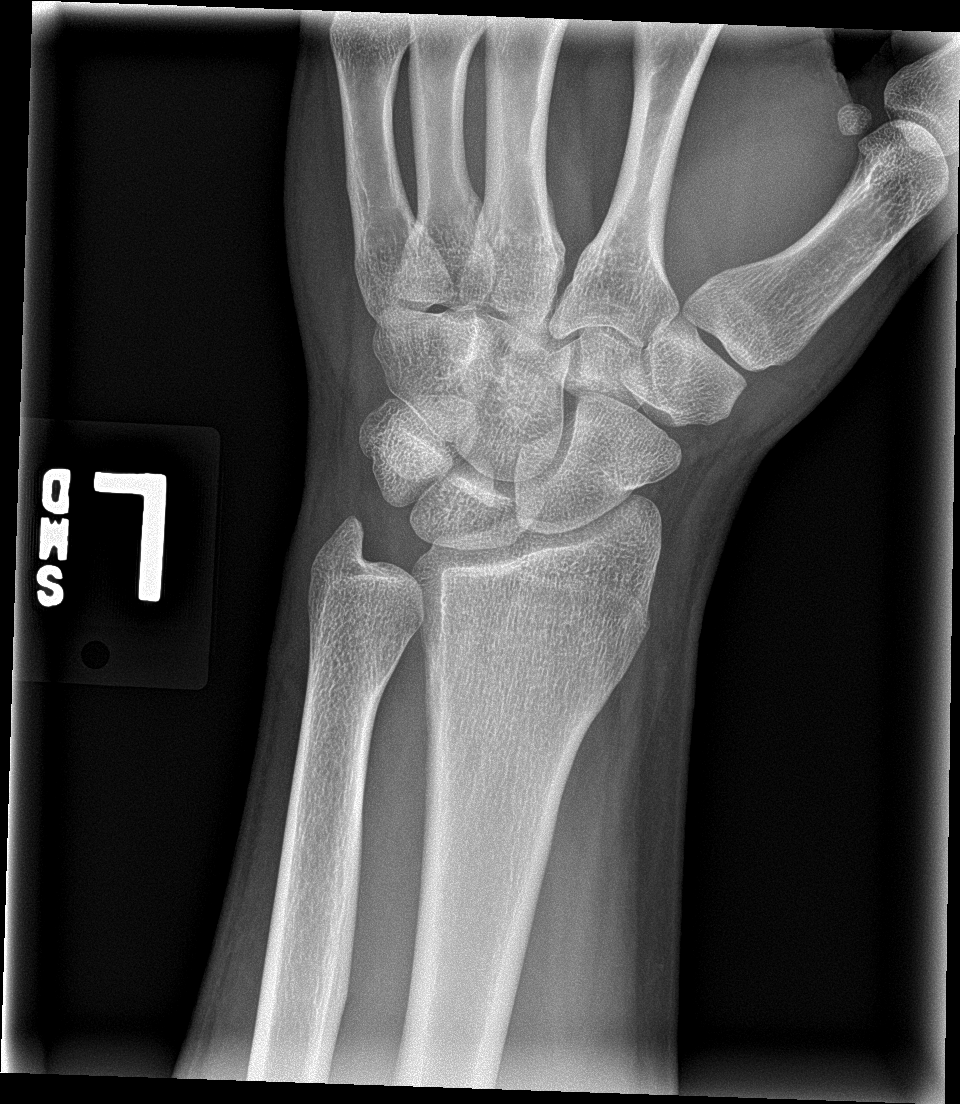

[wrist lat]
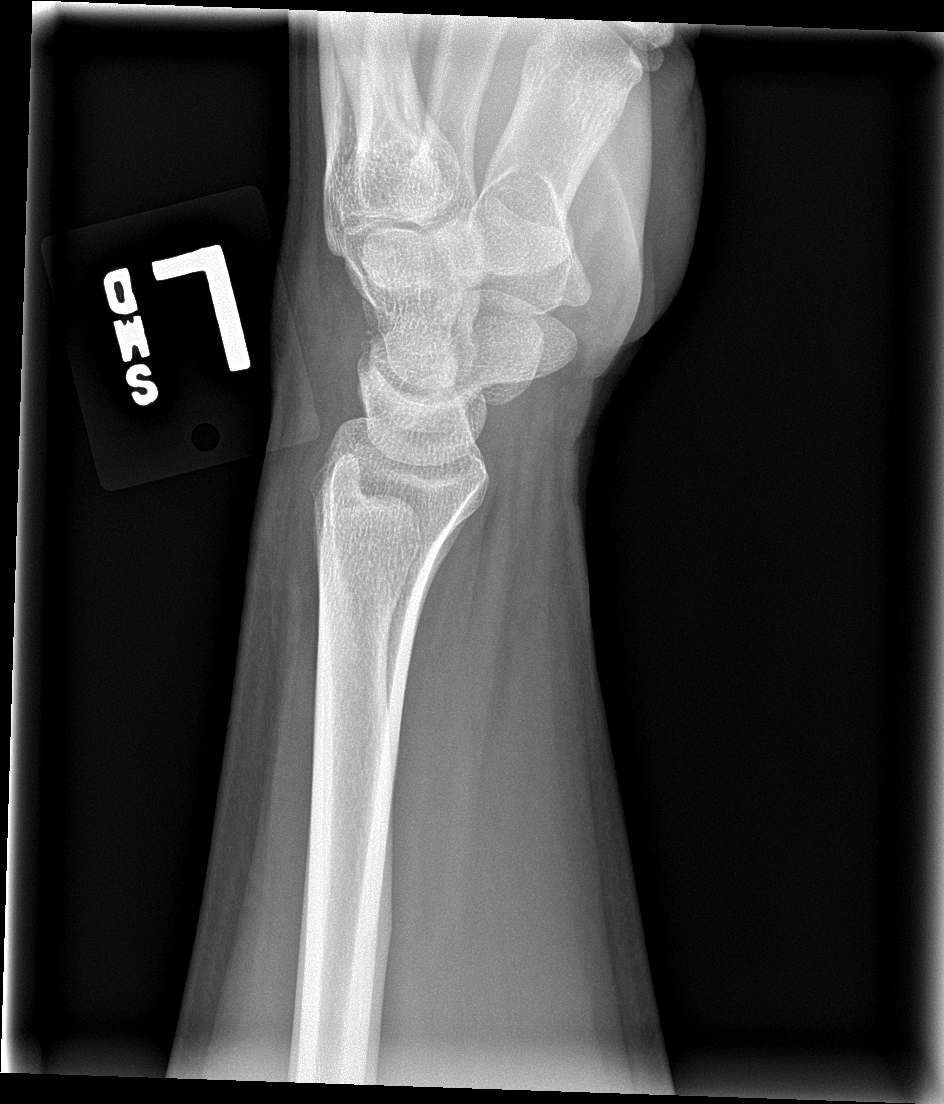

[wrist navicular]
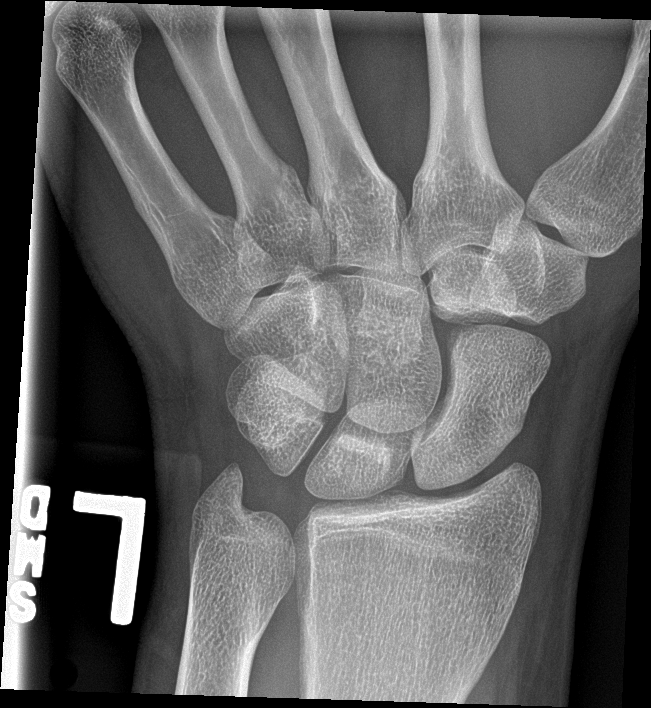

[4 of 4 positions shown; findings below may reference images not displayed]

FINDINGS: Frontal, oblique lateral, and ulnar deviation scaphoid images were
obtained. No evident fracture or dislocation. Joint spaces appear
normal. No erosive change. No radiopaque foreign body.
IMPRESSION: No fracture or dislocation. No appreciable arthropathy. No
radiopaque foreign body evident.

## 2020-05-24 DIAGNOSIS — Z79899 Other long term (current) drug therapy: Secondary | ICD-10-CM | POA: Diagnosis not present

## 2020-05-24 DIAGNOSIS — L700 Acne vulgaris: Secondary | ICD-10-CM | POA: Diagnosis not present

## 2020-06-26 DIAGNOSIS — Z79899 Other long term (current) drug therapy: Secondary | ICD-10-CM | POA: Diagnosis not present

## 2020-06-26 DIAGNOSIS — L700 Acne vulgaris: Secondary | ICD-10-CM | POA: Diagnosis not present

## 2020-07-09 DIAGNOSIS — L905 Scar conditions and fibrosis of skin: Secondary | ICD-10-CM | POA: Diagnosis not present

## 2020-07-09 DIAGNOSIS — L910 Hypertrophic scar: Secondary | ICD-10-CM | POA: Diagnosis not present

## 2020-07-09 DIAGNOSIS — L700 Acne vulgaris: Secondary | ICD-10-CM | POA: Diagnosis not present

## 2020-07-26 DIAGNOSIS — Z79899 Other long term (current) drug therapy: Secondary | ICD-10-CM | POA: Diagnosis not present

## 2020-07-26 DIAGNOSIS — L700 Acne vulgaris: Secondary | ICD-10-CM | POA: Diagnosis not present

## 2020-08-28 DIAGNOSIS — Z79899 Other long term (current) drug therapy: Secondary | ICD-10-CM | POA: Diagnosis not present

## 2020-08-28 DIAGNOSIS — L700 Acne vulgaris: Secondary | ICD-10-CM | POA: Diagnosis not present

## 2020-08-30 DIAGNOSIS — Z20822 Contact with and (suspected) exposure to covid-19: Secondary | ICD-10-CM | POA: Diagnosis not present

## 2020-09-12 DIAGNOSIS — H04123 Dry eye syndrome of bilateral lacrimal glands: Secondary | ICD-10-CM | POA: Diagnosis not present

## 2020-10-01 DIAGNOSIS — L700 Acne vulgaris: Secondary | ICD-10-CM | POA: Diagnosis not present

## 2020-10-01 DIAGNOSIS — Z79899 Other long term (current) drug therapy: Secondary | ICD-10-CM | POA: Diagnosis not present

## 2020-10-01 DIAGNOSIS — H0014 Chalazion left upper eyelid: Secondary | ICD-10-CM | POA: Diagnosis not present

## 2020-10-03 DIAGNOSIS — H00024 Hordeolum internum left upper eyelid: Secondary | ICD-10-CM | POA: Diagnosis not present

## 2020-11-22 DIAGNOSIS — H0014 Chalazion left upper eyelid: Secondary | ICD-10-CM | POA: Diagnosis not present

## 2021-04-23 ENCOUNTER — Encounter: Payer: Self-pay | Admitting: Internal Medicine

## 2021-04-23 ENCOUNTER — Other Ambulatory Visit (HOSPITAL_COMMUNITY)
Admission: RE | Admit: 2021-04-23 | Discharge: 2021-04-23 | Disposition: A | Discharge status: Home / Self Care | Payer: BC Managed Care – PPO | Source: Ambulatory Visit | Attending: Internal Medicine | Admitting: Internal Medicine

## 2021-04-23 ENCOUNTER — Ambulatory Visit (INDEPENDENT_AMBULATORY_CARE_PROVIDER_SITE_OTHER): Payer: BC Managed Care – PPO | Admitting: Internal Medicine

## 2021-04-23 ENCOUNTER — Other Ambulatory Visit: Payer: Self-pay

## 2021-04-23 VITALS — BP 104/59 | HR 86 | Temp 97.1°F | Resp 17 | Ht 68.0 in | Wt 153.4 lb

## 2021-04-23 DIAGNOSIS — N920 Excessive and frequent menstruation with regular cycle: Secondary | ICD-10-CM | POA: Insufficient documentation

## 2021-04-23 DIAGNOSIS — Z3040 Encounter for surveillance of contraceptives, unspecified: Secondary | ICD-10-CM

## 2021-04-23 DIAGNOSIS — D513 Other dietary vitamin B12 deficiency anemia: Secondary | ICD-10-CM | POA: Diagnosis not present

## 2021-04-23 DIAGNOSIS — Z113 Encounter for screening for infections with a predominantly sexual mode of transmission: Secondary | ICD-10-CM

## 2021-04-23 DIAGNOSIS — Z0001 Encounter for general adult medical examination with abnormal findings: Secondary | ICD-10-CM | POA: Diagnosis not present

## 2021-04-23 DIAGNOSIS — Z833 Family history of diabetes mellitus: Secondary | ICD-10-CM | POA: Diagnosis not present

## 2021-04-23 DIAGNOSIS — E559 Vitamin D deficiency, unspecified: Secondary | ICD-10-CM | POA: Diagnosis not present

## 2021-04-23 MED ORDER — NORGESTIM-ETH ESTRAD TRIPHASIC 0.18/0.215/0.25 MG-35 MCG PO TABS: 1.0000 | ORAL_TABLET | Freq: Every day | ORAL | 11 refills | Status: DC

## 2021-04-23 NOTE — Progress Notes (Signed)
Subjective:    Patient ID: Erica Dodson, female    DOB: 19-Jun-1995, 25 y.o.   MRN: [id number]  HPI  Pt presents to the clinic today for her annual exam.  Flu: never Tetanus: 11/2011 Covid: Pfizer x 2 Pap Smear: 03/2020 Dentist: annually  Diet: She does not eat meat. She consumes fruits and veggies. She tries to avoid fried foods. She drinks mostly water, coffee, soda. Exercise: Running  Review of Systems     No past medical history on file.  Current Outpatient Medications  Medication Sig Dispense Refill   Norgestimate-Ethinyl Estradiol Triphasic (TRI-SPRINTEC) 0.18/0.215/0.25 MG-35 MCG tablet Take 1 tablet by mouth at bedtime. 28 tablet 11   No current facility-administered medications for this visit.    Allergies  Allergen Reactions   Metronidazole Dermatitis    Intravaginal only.  Has tolerated PO without problems in past.   Sulfa Antibiotics Hives    Family History  Problem Relation Age of Onset   Heart Problems Father    Asthma Brother     Social History   Socioeconomic History   Marital status: Single    Spouse name: Not on file   Number of children: Not on file   Years of education: Not on file   Highest education level: Not on file  Occupational History   Not on file  Tobacco Use   Smoking status: Never   Smokeless tobacco: Never  Vaping Use   Vaping Use: Never used  Substance and Sexual Activity   Alcohol use: Yes    Alcohol/week: 0.0 standard drinks    Comment: occasional   Drug use: No   Sexual activity: Not on file  Other Topics Concern   Not on file  Social History Narrative   Not on file   Social Determinants of Health   Financial Resource Strain: Not on file  Food Insecurity: Not on file  Transportation Needs: Not on file  Physical Activity: Not on file  Stress: Not on file  Social Connections: Not on file  Intimate Partner Violence: Not on file     Constitutional: Denies fever, malaise, fatigue, headache or abrupt weight  changes.  HEENT: Denies eye pain, eye redness, ear pain, ringing in the ears, wax buildup, runny nose, nasal congestion, bloody nose, or sore throat. Respiratory: Denies difficulty breathing, shortness of breath, cough or sputum production.   Cardiovascular: Denies chest pain, chest tightness, palpitations or swelling in the hands or feet.  Gastrointestinal: Denies abdominal pain, bloating, constipation, diarrhea or blood in the stool.  GU: Pt reports heavy periods. Denies urgency, frequency, pain with urination, burning sensation, blood in urine, odor or discharge. Musculoskeletal: Denies decrease in range of motion, difficulty with gait, muscle pain or joint pain and swelling.  Skin: Denies redness, rashes, lesions or ulcercations.  Neurological: Denies dizziness, difficulty with memory, difficulty with speech or problems with balance and coordination.  Psych: Denies anxiety, depression, SI/HI.  No other specific complaints in a complete review of systems (except as listed in HPI above).  Objective:   Physical Exam BP (!) 104/59 (BP Location: Right Arm, Patient Position: Sitting, Cuff Size: Normal)   Pulse 86   Temp (!) 97.1 F (36.2 C) (Temporal)   Resp 17   Ht 5\' 8"  (1.727 m)   Wt 153 lb 6.4 oz (69.6 kg)   LMP 04/15/2021   SpO2 100%   BMI 23.32 kg/m   Wt Readings from Last 3 Encounters:  03/19/20 164 lb 12.8 oz (74.8 kg)  08/19/19 168 lb (76.2 kg)  08/18/19 168 lb 9.6 oz (76.5 kg)    General: Appears her stated age, well developed, well nourished in NAD. Skin: Warm, dry and intact.  HEENT: Head: normal shape and size; Eyes: sclera white and EOMs intact;  Neck:  Neck supple, trachea midline. No masses, lumps or thyromegaly present.  Cardiovascular: Normal rate and rhythm. S1,S2 noted.  No murmur, rubs or gallops noted. No JVD or BLE edema.  Pulmonary/Chest: Normal effort and positive vesicular breath sounds. No respiratory distress. No wheezes, rales or ronchi noted.   Abdomen: Soft and nontender. Normal bowel sounds. No distention or masses noted. Liver, spleen and kidneys non palpable. Musculoskeletal: Strength 5/5 BEU/BLE. No difficulty with gait.  Neurological: Alert and oriented. Cranial nerves II-XII grossly intact. Coordination normal.  Psychiatric: Mood and affect normal. Anxious appearing. Judgment and thought content normal.    BMET    Component Value Date/Time   NA 139 03/21/2020 1116   K 4.3 03/21/2020 1116   CL 105 03/21/2020 1116   CO2 22 03/21/2020 1116   GLUCOSE 88 03/21/2020 1116   BUN 12 03/21/2020 1116   CREATININE 0.85 03/21/2020 1116   CALCIUM 9.4 03/21/2020 1116   GFRNONAA 95 03/21/2020 1116   GFRAA 109 03/21/2020 1116    Lipid Panel     Component Value Date/Time   CHOL 130 03/21/2020 1116   TRIG 60 03/21/2020 1116   HDL 49 03/21/2020 1116   CHOLHDL 2.7 03/21/2020 1116   LDLCALC 68 03/21/2020 1116    CBC    Component Value Date/Time   WBC 7.1 03/21/2020 1116   RBC 4.64 03/21/2020 1116   HGB 13.8 03/21/2020 1116   HCT 40.1 03/21/2020 1116   PLT 251 03/21/2020 1116   MCV 86 03/21/2020 1116   MCH 29.7 03/21/2020 1116   MCHC 34.4 03/21/2020 1116   RDW 12.3 03/21/2020 1116   LYMPHSABS 2.8 03/21/2020 1116   EOSABS 0.1 03/21/2020 1116   BASOSABS 0.0 03/21/2020 1116    Hgb A1C No results found for: HGBA1C          Assessment & Plan:   Preventative Health Maintenance:  She declines flu shot today Tetanus UTD Encouraged her to get her covid booster Pap smear UTD Encouraged her to consume a balanced diet and exercise regimen Advised her to see an eye doctor and dentist annually Will check CBC, CMET, Lipid and A1C today  Heavy Periods, Screen for STD:  Will check IBC panel, Vit D, B12, TSH, HIV, RPR, Hep C and Wet prep  RTC in 1 year, sooner if needed Nicki Reaper, NP This visit occurred during the SARS-CoV-2 public health emergency.  Safety protocols were in place, including screening questions  prior to the visit, additional usage of staff PPE, and extensive cleaning of exam room while observing appropriate contact time as indicated for disinfecting solutions.

## 2021-04-23 NOTE — Patient Instructions (Signed)

## 2021-04-24 LAB — HEMOGLOBIN A1C
Hgb A1c MFr Bld: 4.5 % of total Hgb (ref <5.7)
Mean Plasma Glucose: 82 mg/dL
eAG (mmol/L): 4.6 mmol/L

## 2021-04-24 LAB — CBC
HCT: 40.5 % (ref 35.0–45.0)
Hemoglobin: 13.8 g/dL (ref 11.7–15.5)
MCH: 29.8 pg (ref 27.0–33.0)
MCHC: 34.1 g/dL (ref 32.0–36.0)
MCV: 87.5 fL (ref 80.0–100.0)
MPV: 10.8 fL (ref 7.5–12.5)
Platelets: 338 10*3/uL (ref 140–400)
RBC: 4.63 10*6/uL (ref 3.80–5.10)
RDW: 12.1 % (ref 11.0–15.0)
WBC: 8.8 10*3/uL (ref 3.8–10.8)

## 2021-04-24 LAB — COMPLETE METABOLIC PANEL WITH GFR
AG Ratio: 1.6 (calc) (ref 1.0–2.5)
ALT: 10 U/L (ref 6–29)
AST: 13 U/L (ref 10–30)
Albumin: 4.6 g/dL (ref 3.6–5.1)
Alkaline phosphatase (APISO): 55 U/L (ref 31–125)
BUN: 12 mg/dL (ref 7–25)
CO2: 28 mmol/L (ref 20–32)
Calcium: 9.7 mg/dL (ref 8.6–10.2)
Chloride: 106 mmol/L (ref 98–110)
Creat: 0.95 mg/dL (ref 0.50–0.96)
Globulin: 2.8 g/dL (calc) (ref 1.9–3.7)
Glucose, Bld: 97 mg/dL (ref 65–139)
Potassium: 4.3 mmol/L (ref 3.5–5.3)
Sodium: 140 mmol/L (ref 135–146)
Total Bilirubin: 0.4 mg/dL (ref 0.2–1.2)
Total Protein: 7.4 g/dL (ref 6.1–8.1)
eGFR: 84 mL/min/{1.73_m2} (ref >=60)

## 2021-04-24 LAB — VITAMIN B12: Vitamin B-12: 288 pg/mL (ref 200–1100)

## 2021-04-24 LAB — ADVANCED WRITTEN NOTIFICATION (AWN) TEST REFUSAL

## 2021-04-24 LAB — HEPATITIS C ANTIBODY
Hepatitis C Ab: NONREACTIVE
SIGNAL TO CUT-OFF: 0.05 (ref <1.00)

## 2021-04-24 LAB — LIPID PANEL
Cholesterol: 132 mg/dL (ref <200)
HDL: 52 mg/dL (ref >=50)
LDL Cholesterol (Calc): 68 mg/dL (calc)
Non-HDL Cholesterol (Calc): 80 mg/dL (calc) (ref <130)
Total CHOL/HDL Ratio: 2.5 (calc) (ref <5.0)
Triglycerides: 43 mg/dL (ref <150)

## 2021-04-24 LAB — TSH: TSH: 2.01 mIU/L

## 2021-04-24 LAB — IRON,TIBC AND FERRITIN PANEL
%SAT: 36 % (calc) (ref 16–45)
Ferritin: 56 ng/mL (ref 16–154)
Iron: 111 ug/dL (ref 40–190)
TIBC: 307 mcg/dL (calc) (ref 250–450)

## 2021-04-24 LAB — HIV ANTIBODY (ROUTINE TESTING W REFLEX): HIV 1&2 Ab, 4th Generation: NONREACTIVE

## 2021-04-24 LAB — RPR: RPR Ser Ql: NONREACTIVE

## 2021-04-24 LAB — VITAMIN D 25 HYDROXY (VIT D DEFICIENCY, FRACTURES): Vit D, 25-Hydroxy: 25 ng/mL — ABNORMAL LOW (ref 30–100)

## 2021-04-25 ENCOUNTER — Encounter: Payer: Self-pay | Admitting: Internal Medicine

## 2021-04-25 LAB — CERVICOVAGINAL ANCILLARY ONLY
Bacterial Vaginitis (gardnerella): POSITIVE — AB
Candida Glabrata: NEGATIVE
Candida Vaginitis: NEGATIVE
Chlamydia: NEGATIVE
Comment: NEGATIVE
Comment: NEGATIVE
Comment: NEGATIVE
Comment: NEGATIVE
Comment: NEGATIVE
Comment: NORMAL
Neisseria Gonorrhea: NEGATIVE
Trichomonas: NEGATIVE

## 2021-04-26 MED ORDER — METRONIDAZOLE 500 MG PO TABS: 500.0000 mg | ORAL_TABLET | Freq: Two times a day (BID) | ORAL | 0 refills | Status: DC

## 2021-05-06 DIAGNOSIS — H00014 Hordeolum externum left upper eyelid: Secondary | ICD-10-CM | POA: Diagnosis not present

## 2021-07-28 DIAGNOSIS — R350 Frequency of micturition: Secondary | ICD-10-CM | POA: Diagnosis not present

## 2021-07-28 DIAGNOSIS — Z113 Encounter for screening for infections with a predominantly sexual mode of transmission: Secondary | ICD-10-CM | POA: Diagnosis not present

## 2022-01-07 ENCOUNTER — Ambulatory Visit: Payer: BC Managed Care – PPO | Admitting: Family Medicine

## 2022-01-07 ENCOUNTER — Ambulatory Visit (INDEPENDENT_AMBULATORY_CARE_PROVIDER_SITE_OTHER): Payer: BC Managed Care – PPO | Admitting: Internal Medicine

## 2022-01-07 ENCOUNTER — Encounter: Payer: Self-pay | Admitting: Internal Medicine

## 2022-01-07 VITALS — BP 114/62 | HR 68 | Temp 97.8°F | Wt 158.0 lb

## 2022-01-07 DIAGNOSIS — R0981 Nasal congestion: Secondary | ICD-10-CM

## 2022-01-07 DIAGNOSIS — J029 Acute pharyngitis, unspecified: Secondary | ICD-10-CM

## 2022-01-07 LAB — POCT RAPID STREP A (OFFICE): Rapid Strep A Screen: NEGATIVE

## 2022-01-07 MED ORDER — METHYLPREDNISOLONE ACETATE 80 MG/ML IJ SUSP
80.0000 mg | Freq: Once | INTRAMUSCULAR | Status: AC
  Administered 2022-01-07: 80 mg via INTRAMUSCULAR

## 2022-01-07 NOTE — Patient Instructions (Signed)
Sore Throat When you have a sore throat, your throat may feel: Tender. Burning. Irritated. Scratchy. Painful when you swallow. Painful when you talk. Many things can cause a sore throat, such as: An infection. Allergies. Dry air. Smoke or pollution. Radiation treatment for cancer. Gastroesophageal reflux disease (GERD). A tumor. A sore throat can be the first sign of another sickness. It can happen with other problems, like: Coughing. Sneezing. Fever. Swelling of the glands in the neck. Most sore throats go away without treatment. Follow these instructions at home:     Medicines Take over-the-counter and prescription medicines only as told by your doctor. Children often get sore throats. Do not give your child aspirin. Use throat sprays to soothe your throat as told by your health care provider. Managing pain To help with pain: Sip warm liquids, such as broth, herbal tea, or warm water. Eat or drink cold or frozen liquids, such as frozen ice pops. Rinse your mouth (gargle) with a salt water mixture 3-4 times a day or as needed. To make salt water, dissolve -1 tsp (3-6 g) of salt in 1 cup (237 mL) of warm water. Do not swallow this mixture. Suck on hard candy or throat lozenges. Put a cool-mist humidifier in your bedroom at night. Sit in the bathroom with the door closed for 5-10 minutes while you run hot water in the shower. General instructions Do not smoke or use any products that contain nicotine or tobacco. If you need help quitting, ask your doctor. Get plenty of rest. Drink enough fluid to keep your pee (urine) pale yellow. Wash your hands often for at least 20 seconds with soap and water. If soap and water are not available, use hand sanitizer. Contact a doctor if: You have a fever for more than 2-3 days. You keep having symptoms for more than 2-3 days. Your throat does not get better in 7 days. You have a fever and your symptoms suddenly get worse. Your  child who is 3 months to 3 years old has a temperature of 102.2F (39C) or higher. Get help right away if: You have trouble breathing. You cannot swallow fluids, soft foods, or your spit. You have swelling in your throat or neck that gets worse. You feel like you may vomit (nauseous) and this feeling lasts a long time. You cannot stop vomiting. These symptoms may be an emergency. Get help right away. Call your local emergency services (911 in the [place] Do not wait to see if the symptoms will go away. Do not drive yourself to the hospital. Summary A sore throat is a painful, burning, irritated, or scratchy throat. Many things can cause a sore throat. Take over-the-counter medicines only as told by your doctor. Get plenty of rest. Drink enough fluid to keep your pee (urine) pale yellow. Contact a doctor if your symptoms get worse or your sore throat does not get better within 7 days. This information is not intended to replace advice given to you by your health care provider. Make sure you discuss any questions you have with your health care provider. Document Revised: 08/22/2020 Document Reviewed: 08/22/2020 Elsevier Patient Education  2023 [employer]  

## 2022-01-07 NOTE — Progress Notes (Signed)
Subjective:    Patient ID: Erica Dodson, female    DOB: [birth year], 26 y.o.   MRN: [id number]  HPI  Patient presents to clinic today with c/o nasal congestion and sore throat. This started 4 days. She is blowing yellow mucous out of her nose. She has had some difficulty swallowing. She denies headache, runny nose, ear pain, cough and shortness of breath. She denies fever, chills or body aches. She has taken Minocycline with some relief of symptoms. She did not take a covid test.  Review of Systems     No past medical history on file.  Current Outpatient Medications  Medication Sig Dispense Refill   Biotin 10 MG CAPS Take by mouth.     metroNIDAZOLE (FLAGYL) 500 MG tablet Take 1 tablet (500 mg total) by mouth 2 (two) times daily. Do not drink alcohol while taking this medicine. 14 tablet 0   Norgestimate-Ethinyl Estradiol Triphasic (TRI-SPRINTEC) 0.18/0.215/0.25 MG-35 MCG tablet Take 1 tablet by mouth at bedtime. 28 tablet 11   No current facility-administered medications for this visit.    Allergies  Allergen Reactions   Metronidazole Dermatitis    Intravaginal only.  Has tolerated PO without problems in past.   Sulfa Antibiotics Hives    Family History  Problem Relation Age of Onset   Heart Problems Father    Asthma Brother     Social History   Socioeconomic History   Marital status: Single    Spouse name: Not on file   Number of children: Not on file   Years of education: Not on file   Highest education level: Not on file  Occupational History   Not on file  Tobacco Use   Smoking status: Never   Smokeless tobacco: Never  Vaping Use   Vaping Use: Never used  Substance and Sexual Activity   Alcohol use: Yes    Alcohol/week: 0.0 standard drinks of alcohol    Comment: occasional   Drug use: No   Sexual activity: Not on file  Other Topics Concern   Not on file  Social History Narrative   Not on file   Social Determinants of Health   Financial Resource  Strain: Not on file  Food Insecurity: Not on file  Transportation Needs: Not on file  Physical Activity: Not on file  Stress: Not on file  Social Connections: Not on file  Intimate Partner Violence: Not on file     Constitutional: Denies fever, malaise, fatigue, headache or abrupt weight changes.  HEENT: Patient reports nasal congestion and sore throat.  Denies eye pain, eye redness, ear pain, ringing in the ears, wax buildup, runny nose, bloody nose. Respiratory: Denies difficulty breathing, shortness of breath, cough or sputum production.   Cardiovascular: Denies chest pain, chest tightness, palpitations or swelling in the hands or feet.  Gastrointestinal: Denies abdominal pain, bloating, constipation, diarrhea or blood in the stool.   No other specific complaints in a complete review of systems (except as listed in HPI above).  Objective:   Physical Exam  BP 114/62 (BP Location: Left Arm, Patient Position: Sitting, Cuff Size: Normal)   Pulse 68   Temp 97.8 F (36.6 C) (Temporal)   Wt 158 lb (71.7 kg)   SpO2 100%   BMI 24.02 kg/m   Wt Readings from Last 3 Encounters:  04/23/21 153 lb 6.4 oz (69.6 kg)  03/19/20 164 lb 12.8 oz (74.8 kg)  08/19/19 168 lb (76.2 kg)    General: Appears her  stated  age, well developed, well nourished in NAD. Skin: Warm, dry and intact. No rashes noted. HEENT: Head: normal shape and size; Eyes: sclera white, no icterus, conjunctiva pink, PERRLA and EOMs intact;  Throat/Mouth: Teeth present, mucosa erythematous on the left and moist, no exudate, lesions or ulcerations noted.  Neck: Left anterior cervical adenopathy noted. Cardiovascular: Normal rate and rhythm. S1,S2 noted.  No murmur, rubs or gallops noted.  Pulmonary/Chest: Normal effort and positive vesicular breath sounds. No respiratory distress. No wheezes, rales or ronchi noted.     BMET    Component Value Date/Time   NA 140 04/23/2021 1427   NA 139 03/21/2020 1116   K 4.3  04/23/2021 1427   CL 106 04/23/2021 1427   CO2 28 04/23/2021 1427   GLUCOSE 97 04/23/2021 1427   BUN 12 04/23/2021 1427   BUN 12 03/21/2020 1116   CREATININE 0.95 04/23/2021 1427   CALCIUM 9.7 04/23/2021 1427   GFRNONAA 95 03/21/2020 1116   GFRAA 109 03/21/2020 1116    Lipid Panel     Component Value Date/Time   CHOL 132 04/23/2021 1427   CHOL 130 03/21/2020 1116   TRIG 43 04/23/2021 1427   HDL 52 04/23/2021 1427   HDL 49 03/21/2020 1116   CHOLHDL 2.5 04/23/2021 1427   LDLCALC 68 04/23/2021 1427    CBC    Component Value Date/Time   WBC 8.8 04/23/2021 1427   RBC 4.63 04/23/2021 1427   HGB 13.8 04/23/2021 1427   HGB 13.8 03/21/2020 1116   HCT 40.5 04/23/2021 1427   HCT 40.1 03/21/2020 1116   PLT 338 04/23/2021 1427   PLT 251 03/21/2020 1116   MCV 87.5 04/23/2021 1427   MCV 86 03/21/2020 1116   MCH 29.8 04/23/2021 1427   MCHC 34.1 04/23/2021 1427   RDW 12.1 04/23/2021 1427   RDW 12.3 03/21/2020 1116   LYMPHSABS 2.8 03/21/2020 1116   EOSABS 0.1 03/21/2020 1116   BASOSABS 0.0 03/21/2020 1116    Hgb A1C Lab Results  Component Value Date   HGBA1C 4.5 04/23/2021           Assessment & Plan:   Nasal Congestion, Sore Throat:  RST: negative 80 mg Depo Medrol IM today Does not appear infected, stop Minocycline Encouraged salt water gargles  RTC in 4 months for your annual exam   Nicki Reaper, NP

## 2022-01-27 DIAGNOSIS — Z113 Encounter for screening for infections with a predominantly sexual mode of transmission: Secondary | ICD-10-CM | POA: Diagnosis not present

## 2022-01-27 DIAGNOSIS — R1084 Generalized abdominal pain: Secondary | ICD-10-CM | POA: Diagnosis not present

## 2022-02-01 DIAGNOSIS — Z202 Contact with and (suspected) exposure to infections with a predominantly sexual mode of transmission: Secondary | ICD-10-CM | POA: Diagnosis not present

## 2022-02-01 DIAGNOSIS — Z719 Counseling, unspecified: Secondary | ICD-10-CM | POA: Diagnosis not present

## 2022-02-07 ENCOUNTER — Encounter: Payer: Self-pay | Admitting: Internal Medicine

## 2022-02-07 ENCOUNTER — Ambulatory Visit (INDEPENDENT_AMBULATORY_CARE_PROVIDER_SITE_OTHER): Payer: BC Managed Care – PPO | Admitting: Internal Medicine

## 2022-02-07 ENCOUNTER — Other Ambulatory Visit (HOSPITAL_COMMUNITY)
Admission: RE | Admit: 2022-02-07 | Discharge: 2022-02-07 | Disposition: A | Discharge status: Home / Self Care | Payer: BC Managed Care – PPO | Source: Ambulatory Visit | Attending: Internal Medicine | Admitting: Internal Medicine

## 2022-02-07 VITALS — BP 128/84 | HR 80 | Ht 68.0 in | Wt 157.2 lb

## 2022-02-07 DIAGNOSIS — N898 Other specified noninflammatory disorders of vagina: Secondary | ICD-10-CM | POA: Diagnosis not present

## 2022-02-07 NOTE — Progress Notes (Signed)
Subjective:    Patient ID: Erica Dodson, female    DOB: [birth year], 26 y.o.   MRN: [id number]  HPI  Patient presents to the clinic today with complaint of vaginal discharge.  She noticed this about 3 weeks ago.  She denies pelvic pain, painful intercourse, abnormal uterine bleeding, vaginal odor, urinary urgency, frequency, dysuria or blood in her urine.  She was seen at urgent care 8/21 for the same.  She was tested for HIV, syphilis, hep B and hep C which were all negative.  Her urinalysis was negative.  Her wet prep was negative however they did treat her with oral metronidazole.  She presented back to urgent care 8/26 for the same.  Urinalysis was again normal as well as wet prep.  She has had BV in the past and reports this seems to be the same.  Review of Systems     No past medical history on file.  Current Outpatient Medications  Medication Sig Dispense Refill   Biotin 10 MG CAPS Take by mouth.     Norgestimate-Ethinyl Estradiol Triphasic (TRI-SPRINTEC) 0.18/0.215/0.25 MG-35 MCG tablet Take 1 tablet by mouth at bedtime. 28 tablet 11   No current facility-administered medications for this visit.    Allergies  Allergen Reactions   Metronidazole Dermatitis    Intravaginal only.  Has tolerated PO without problems in past.   Sulfa Antibiotics Hives    Family History  Problem Relation Age of Onset   Heart Problems Father    Asthma Brother     Social History   Socioeconomic History   Marital status: Single    Spouse name: Not on file   Number of children: Not on file   Years of education: Not on file   Highest education level: Not on file  Occupational History   Not on file  Tobacco Use   Smoking status: Never   Smokeless tobacco: Never  Vaping Use   Vaping Use: Never used  Substance and Sexual Activity   Alcohol use: Yes    Alcohol/week: 0.0 standard drinks of alcohol    Comment: occasional   Drug use: No   Sexual activity: Not on file  Other Topics Concern    Not on file  Social History Narrative   Not on file   Social Determinants of Health   Financial Resource Strain: Not on file  Food Insecurity: Not on file  Transportation Needs: Not on file  Physical Activity: Not on file  Stress: Not on file  Social Connections: Not on file  Intimate Partner Violence: Not on file     Constitutional: Denies fever, malaise, fatigue, headache or abrupt weight changes.  Respiratory: Denies difficulty breathing, shortness of breath, cough or sputum production.   Cardiovascular: Denies chest pain, chest tightness, palpitations or swelling in the hands or feet.  Gastrointestinal: Denies abdominal pain, bloating, constipation, diarrhea or blood in the stool.  GU: Patient reports vaginal discharge  Denies urgency, frequency, pain with urination, burning sensation, blood in urine or vaginal odor.  No other specific complaints in a complete review of systems (except as listed in HPI above).  Objective:   Physical Exam   BP 128/84   Pulse 80   Ht 5\' 8"  (1.727 m)   Wt 157 lb 3.2 oz (71.3 kg)   SpO2 100%   BMI 23.90 kg/m   Wt Readings from Last 3 Encounters:  01/07/22 158 lb (71.7 kg)  04/23/21 153 lb 6.4 oz (69.6 kg)  03/19/20 164  lb 12.8 oz (74.8 kg)    General: Appears her stated age, well developed, well nourished in NAD. Cardiovascular: Normal rate and rhythm. S1,S2 noted.  No murmur, rubs or gallops noted.  Pulmonary/Chest: Normal effort and positive vesicular breath sounds. No respiratory distress. No wheezes, rales or ronchi noted.  Pelvic: Self swabbed. Neurological: Alert and oriented. BMET    Component Value Date/Time   NA 140 04/23/2021 1427   NA 139 03/21/2020 1116   K 4.3 04/23/2021 1427   CL 106 04/23/2021 1427   CO2 28 04/23/2021 1427   GLUCOSE 97 04/23/2021 1427   BUN 12 04/23/2021 1427   BUN 12 03/21/2020 1116   CREATININE 0.95 04/23/2021 1427   CALCIUM 9.7 04/23/2021 1427   GFRNONAA 95 03/21/2020 1116   GFRAA 109  03/21/2020 1116    Lipid Panel     Component Value Date/Time   CHOL 132 04/23/2021 1427   CHOL 130 03/21/2020 1116   TRIG 43 04/23/2021 1427   HDL 52 04/23/2021 1427   HDL 49 03/21/2020 1116   CHOLHDL 2.5 04/23/2021 1427   LDLCALC 68 04/23/2021 1427    CBC    Component Value Date/Time   WBC 8.8 04/23/2021 1427   RBC 4.63 04/23/2021 1427   HGB 13.8 04/23/2021 1427   HGB 13.8 03/21/2020 1116   HCT 40.5 04/23/2021 1427   HCT 40.1 03/21/2020 1116   PLT 338 04/23/2021 1427   PLT 251 03/21/2020 1116   MCV 87.5 04/23/2021 1427   MCV 86 03/21/2020 1116   MCH 29.8 04/23/2021 1427   MCHC 34.1 04/23/2021 1427   RDW 12.1 04/23/2021 1427   RDW 12.3 03/21/2020 1116   LYMPHSABS 2.8 03/21/2020 1116   EOSABS 0.1 03/21/2020 1116   BASOSABS 0.0 03/21/2020 1116    Hgb A1C Lab Results  Component Value Date   HGBA1C 4.5 04/23/2021           Assessment & Plan:   UC Follow Up for Vaginal Discharge:  Urgent care notes and labs reviewed We will check wet prep for gonorrhea chlamydia, trichomonas, BV and yeast We will plan treatment until labs have been reviewed  RTC in 3 months for your annual exam Nicki Reaper, NP

## 2022-02-07 NOTE — Patient Instructions (Signed)
Vaginitis  Vaginitis is irritation and swelling of the vagina. Treatment will depend on the cause. What are the causes? It can be caused by: Bacteria. Yeast. A parasite. A virus. Low hormone levels. Bubble baths, scented tampons, and feminine sprays. Other things can change the balance of the yeast and bacteria that live in the vagina. These include: Antibiotic medicines. Not being clean enough. Some birth control methods. Sex. Infection. Diabetes. A weakened body defense system (immune system). What increases the risk? Smoking or being around someone who smokes. Using washes (douches), scented tampons, or scented pads. Wearing tight pants or thong underwear. Using birth control pills or an IUD. Having sex without a condom or having a lot of partners. Having an STI. Using a certain product to kill sperm (nonoxynol-9). Eating foods that are high in sugar. Having diabetes. Having low levels of a female hormone. Having a weakened body defense system. Being pregnant or breastfeeding. What are the signs or symptoms? Fluid coming from the vagina that is not normal. A bad smell. Itching, pain, or swelling. Pain with sex. Pain or burning when you pee (urinate). Sometimes there are no symptoms. How is this treated? Treatment may include: Antibiotic creams or pills. Antifungal medicines. Medicines to ease symptoms if you have a virus. Your sex partner should also be treated. Estrogen medicines. Avoiding scented soaps, sprays, or douches. Stopping use of products that caused irritation and then using a cream to treat symptoms. Follow these instructions at home: Lifestyle Keep the area around your vagina clean and dry. Avoid using soap. Rinse the area with water. Until your doctor says it is okay: Do not use washes for the vagina. Do not use tampons. Do not have sex. Wipe from front to back after going to the bathroom. When your doctor says it is okay, practice safe sex  and use condoms. General instructions Take over-the-counter and prescription medicines only as told by your doctor. If you were prescribed an antibiotic medicine, take or use it as told by your doctor. Do not stop taking or using it even if you start to feel better. Keep all follow-up visits. How is this prevented? Do not use things that can irritate the vagina, such as fabric softeners. Avoid these products if they are scented: Sprays. Detergents. Tampons. Products for cleaning the vagina. Soaps or bubble baths. Let air reach your vagina. To do this: Wear cotton underwear. Do not wear: Underwear while you sleep. Tight pants. Thong underwear. Underwear or nylons without a cotton panel. Take off any wet clothing, such as bathing suits, as soon as you can. Practice safe sex and use condoms. Contact a doctor if: You have pain in your belly or in the area between your hips. You have a fever or chills. Your symptoms last for more than 2-3 days. Get help right away if: You have a fever and your symptoms get worse all of a sudden. Summary Vaginitis is irritation and swelling of the vagina. Treatment will depend on the cause of the condition. Do not use washes or tampons or have sex until your doctor says it is okay. This information is not intended to replace advice given to you by your health care provider. Make sure you discuss any questions you have with your health care provider. Document Revised: 11/24/2019 Document Reviewed: 11/24/2019 Elsevier Patient Education  2023 [employer]  

## 2022-02-11 LAB — CERVICOVAGINAL ANCILLARY ONLY
Bacterial Vaginitis (gardnerella): NEGATIVE
Candida Glabrata: NEGATIVE
Candida Vaginitis: NEGATIVE
Chlamydia: NEGATIVE
Comment: NEGATIVE
Comment: NEGATIVE
Comment: NEGATIVE
Comment: NEGATIVE
Comment: NEGATIVE
Comment: NORMAL
Neisseria Gonorrhea: NEGATIVE
Trichomonas: NEGATIVE

## 2022-03-05 DIAGNOSIS — Z124 Encounter for screening for malignant neoplasm of cervix: Secondary | ICD-10-CM | POA: Diagnosis not present

## 2022-03-05 DIAGNOSIS — Z01419 Encounter for gynecological examination (general) (routine) without abnormal findings: Secondary | ICD-10-CM | POA: Diagnosis not present

## 2022-03-05 DIAGNOSIS — Z1331 Encounter for screening for depression: Secondary | ICD-10-CM | POA: Diagnosis not present

## 2022-03-05 DIAGNOSIS — Z7251 High risk heterosexual behavior: Secondary | ICD-10-CM | POA: Diagnosis not present

## 2022-03-05 DIAGNOSIS — Z130 Encounter for screening for diseases of the blood and blood-forming organs and certain disorders involving the immune mechanism: Secondary | ICD-10-CM | POA: Diagnosis not present

## 2022-03-31 DIAGNOSIS — R102 Pelvic and perineal pain: Secondary | ICD-10-CM | POA: Diagnosis not present

## 2022-04-01 DIAGNOSIS — R102 Pelvic and perineal pain: Secondary | ICD-10-CM | POA: Diagnosis not present

## 2022-04-14 DIAGNOSIS — R102 Pelvic and perineal pain: Secondary | ICD-10-CM | POA: Diagnosis not present

## 2022-04-17 DIAGNOSIS — L700 Acne vulgaris: Secondary | ICD-10-CM | POA: Diagnosis not present

## 2022-04-17 DIAGNOSIS — D485 Neoplasm of uncertain behavior of skin: Secondary | ICD-10-CM | POA: Diagnosis not present

## 2022-04-17 DIAGNOSIS — D225 Melanocytic nevi of trunk: Secondary | ICD-10-CM | POA: Diagnosis not present

## 2023-01-05 DIAGNOSIS — L720 Epidermal cyst: Secondary | ICD-10-CM | POA: Diagnosis not present

## 2023-03-20 ENCOUNTER — Ambulatory Visit (INDEPENDENT_AMBULATORY_CARE_PROVIDER_SITE_OTHER): Payer: BC Managed Care – PPO | Admitting: Internal Medicine

## 2023-03-20 ENCOUNTER — Other Ambulatory Visit (HOSPITAL_COMMUNITY)
Admission: RE | Admit: 2023-03-20 | Discharge: 2023-03-20 | Disposition: A | Discharge status: Home / Self Care | Payer: BC Managed Care – PPO | Source: Ambulatory Visit | Attending: Internal Medicine | Admitting: Internal Medicine

## 2023-03-20 ENCOUNTER — Encounter: Payer: Self-pay | Admitting: Internal Medicine

## 2023-03-20 VITALS — BP 132/74 | HR 87 | Temp 95.1°F | Ht 69.0 in | Wt 164.0 lb

## 2023-03-20 DIAGNOSIS — Z789 Other specified health status: Secondary | ICD-10-CM

## 2023-03-20 DIAGNOSIS — Z0001 Encounter for general adult medical examination with abnormal findings: Secondary | ICD-10-CM

## 2023-03-20 DIAGNOSIS — Z113 Encounter for screening for infections with a predominantly sexual mode of transmission: Secondary | ICD-10-CM | POA: Insufficient documentation

## 2023-03-20 DIAGNOSIS — Z124 Encounter for screening for malignant neoplasm of cervix: Secondary | ICD-10-CM | POA: Insufficient documentation

## 2023-03-20 DIAGNOSIS — R739 Hyperglycemia, unspecified: Secondary | ICD-10-CM | POA: Diagnosis not present

## 2023-03-20 DIAGNOSIS — Z01419 Encounter for gynecological examination (general) (routine) without abnormal findings: Secondary | ICD-10-CM | POA: Diagnosis not present

## 2023-03-20 DIAGNOSIS — Z1151 Encounter for screening for human papillomavirus (HPV): Secondary | ICD-10-CM | POA: Diagnosis not present

## 2023-03-20 DIAGNOSIS — Z136 Encounter for screening for cardiovascular disorders: Secondary | ICD-10-CM

## 2023-03-20 NOTE — Patient Instructions (Signed)

## 2023-03-20 NOTE — Progress Notes (Signed)
Subjective:    Patient ID: Erica Dodson, female    DOB: [birth year], 27 y.o.   MRN: [id number]  HPI  Patient presents to clinic today for her annual exam.  Flu: never Tetanus: 11/2011 COVID: X 2 Pap smear: 03/2020 Dentist: biannually  Diet: She does eat meat. She consumes fruits and veggies. She rarely eats fried foods. She drinks mostly water, coffee. Exercise: Run  Review of Systems     No past medical history on file.  Current Outpatient Medications  Medication Sig Dispense Refill   Biotin 10 MG CAPS Take by mouth.     Norgestimate-Ethinyl Estradiol Triphasic (TRI-SPRINTEC) 0.18/0.215/0.25 MG-35 MCG tablet Take 1 tablet by mouth at bedtime. 28 tablet 11   No current facility-administered medications for this visit.    Allergies  Allergen Reactions   Metronidazole Dermatitis    Intravaginal only.  Has tolerated PO without problems in past.   Sulfa Antibiotics Hives    Family History  Problem Relation Age of Onset   Heart Problems Father    Asthma Brother     Social History   Socioeconomic History   Marital status: Single    Spouse name: Not on file   Number of children: Not on file   Years of education: Not on file   Highest education level: Not on file  Occupational History   Not on file  Tobacco Use   Smoking status: Never   Smokeless tobacco: Never  Vaping Use   Vaping status: Never Used  Substance and Sexual Activity   Alcohol use: Yes    Alcohol/week: 0.0 standard drinks of alcohol    Comment: occasional   Drug use: No   Sexual activity: Not on file  Other Topics Concern   Not on file  Social History Narrative   Not on file   Social Determinants of Health   Financial Resource Strain: Not on file  Food Insecurity: Not on file  Transportation Needs: Not on file  Physical Activity: Not on file  Stress: Not on file  Social Connections: Not on file  Intimate Partner Violence: Not on file     Constitutional: Denies fever, malaise,  fatigue, headache or abrupt weight changes.  HEENT: Denies eye pain, eye redness, ear pain, ringing in the ears, wax buildup, runny nose, nasal congestion, bloody nose, or sore throat. Respiratory: Denies difficulty breathing, shortness of breath, cough or sputum production.   Cardiovascular: Denies chest pain, chest tightness, palpitations or swelling in the hands or feet.  Gastrointestinal: Denies abdominal pain, bloating, constipation, diarrhea or blood in the stool.  GU: Denies urgency, frequency, pain with urination, burning sensation, blood in urine, odor or discharge. Musculoskeletal: Denies decrease in range of motion, difficulty with gait, muscle pain or joint pain and swelling.  Skin: Denies redness, rashes, lesions or ulcercations.  Neurological: Denies dizziness, difficulty with memory, difficulty with speech or problems with balance and coordination.  Psych: Denies anxiety, depression, SI/HI.  No other specific complaints in a complete review of systems (except as listed in HPI above).  Objective:   Physical Exam  BP 132/74 (BP Location: Left Arm, Patient Position: Sitting, Cuff Size: Normal)   Pulse 87   Temp (!) 95.1 F (35.1 C) (Temporal)   Ht 5\' 9"  (1.753 m)   Wt 164 lb (74.4 kg)   SpO2 99%   BMI 24.22 kg/m   Wt Readings from Last 3 Encounters:  02/07/22 157 lb 3.2 oz (71.3 kg)  01/07/22 158 lb (71.7 kg)  04/23/21 153 lb 6.4 oz (69.6 kg)    General: Appears her stated age, well developed, well nourished in NAD. Skin: Warm, dry and intact. HEENT: Head: normal shape and size; Eyes: sclera white, no icterus, conjunctiva pink, PERRLA and EOMs intact;  Neck:  Neck supple, trachea midline. No masses, lumps or thyromegaly present.  Cardiovascular: Normal rate and rhythm. S1,S2 noted.  No murmur, rubs or gallops noted. No JVD or BLE edema.  Pulmonary/Chest: Normal effort and positive vesicular breath sounds. No respiratory distress. No wheezes, rales or ronchi noted.   Abdomen: Soft and nontender. Normal bowel sounds.  Musculoskeletal: Strength 5/5 BUE/BLE. No difficulty with gait.  Neurological: Alert and oriented. Cranial nerves II-XII grossly intact. Coordination normal.  Psychiatric: Mood and affect normal. Behavior is normal. Judgment and thought content normal.    BMET    Component Value Date/Time   NA 140 04/23/2021 1427   NA 139 03/21/2020 1116   K 4.3 04/23/2021 1427   CL 106 04/23/2021 1427   CO2 28 04/23/2021 1427   GLUCOSE 97 04/23/2021 1427   BUN 12 04/23/2021 1427   BUN 12 03/21/2020 1116   CREATININE 0.95 04/23/2021 1427   CALCIUM 9.7 04/23/2021 1427   GFRNONAA 95 03/21/2020 1116   GFRAA 109 03/21/2020 1116    Lipid Panel     Component Value Date/Time   CHOL 132 04/23/2021 1427   CHOL 130 03/21/2020 1116   TRIG 43 04/23/2021 1427   HDL 52 04/23/2021 1427   HDL 49 03/21/2020 1116   CHOLHDL 2.5 04/23/2021 1427   LDLCALC 68 04/23/2021 1427    CBC    Component Value Date/Time   WBC 8.8 04/23/2021 1427   RBC 4.63 04/23/2021 1427   HGB 13.8 04/23/2021 1427   HGB 13.8 03/21/2020 1116   HCT 40.5 04/23/2021 1427   HCT 40.1 03/21/2020 1116   PLT 338 04/23/2021 1427   PLT 251 03/21/2020 1116   MCV 87.5 04/23/2021 1427   MCV 86 03/21/2020 1116   MCH 29.8 04/23/2021 1427   MCHC 34.1 04/23/2021 1427   RDW 12.1 04/23/2021 1427   RDW 12.3 03/21/2020 1116   LYMPHSABS 2.8 03/21/2020 1116   EOSABS 0.1 03/21/2020 1116   BASOSABS 0.0 03/21/2020 1116    Hgb A1C Lab Results  Component Value Date   HGBA1C 4.5 04/23/2021           Assessment & Plan:   Preventative health maintenance:  Flu shot declined Tetanus declined Encouraged her to get her COVID booster Pap smear today with STD screening Encourage her to consume a balanced diet and exercise regimen Advised her to see a dentist annually We will check CBC, c-Met, lipid, A1c today  RTC in 1 year, sooner if needed Nicki Reaper, NP

## 2023-03-21 LAB — CBC
Hematocrit: 40.3 % (ref 34.0–46.6)
Hemoglobin: 13.8 g/dL (ref 11.1–15.9)
MCH: 30 pg (ref 26.6–33.0)
MCHC: 34.2 g/dL (ref 31.5–35.7)
MCV: 88 fL (ref 79–97)
Platelets: 357 10*3/uL (ref 150–450)
RBC: 4.6 x10E6/uL (ref 3.77–5.28)
RDW: 12.2 % (ref 11.7–15.4)
WBC: 12.8 10*3/uL — ABNORMAL HIGH (ref 3.4–10.8)

## 2023-03-21 LAB — COMPREHENSIVE METABOLIC PANEL
ALT: 17 [IU]/L (ref 0–32)
AST: 20 [IU]/L (ref 0–40)
Albumin: 4.6 g/dL (ref 4.0–5.0)
Alkaline Phosphatase: 76 [IU]/L (ref 44–121)
BUN/Creatinine Ratio: 7 — ABNORMAL LOW (ref 9–23)
BUN: 6 mg/dL (ref 6–20)
Bilirubin Total: 0.4 mg/dL (ref 0.0–1.2)
CO2: 22 mmol/L (ref 20–29)
Calcium: 9.8 mg/dL (ref 8.7–10.2)
Chloride: 101 mmol/L (ref 96–106)
Creatinine, Ser: 0.83 mg/dL (ref 0.57–1.00)
Globulin, Total: 2.7 g/dL (ref 1.5–4.5)
Glucose: 89 mg/dL (ref 70–99)
Potassium: 4.1 mmol/L (ref 3.5–5.2)
Sodium: 137 mmol/L (ref 134–144)
Total Protein: 7.3 g/dL (ref 6.0–8.5)
eGFR: 98 mL/min/{1.73_m2} (ref >59)

## 2023-03-21 LAB — HEMOGLOBIN A1C
Est. average glucose Bld gHb Est-mCnc: 88 mg/dL
Hgb A1c MFr Bld: 4.7 % — ABNORMAL LOW (ref 4.8–5.6)

## 2023-03-21 LAB — LIPID PANEL
Chol/HDL Ratio: 2.4 {ratio} (ref 0.0–4.4)
Cholesterol, Total: 132 mg/dL (ref 100–199)
HDL: 54 mg/dL (ref >39)
LDL Chol Calc (NIH): 69 mg/dL (ref 0–99)
Triglycerides: 31 mg/dL (ref 0–149)
VLDL Cholesterol Cal: 9 mg/dL (ref 5–40)

## 2023-03-21 LAB — VITAMIN B12: Vitamin B-12: 1086 pg/mL (ref 232–1245)

## 2023-03-21 LAB — VITAMIN D 25 HYDROXY (VIT D DEFICIENCY, FRACTURES): Vit D, 25-Hydroxy: 53.4 ng/mL (ref 30.0–100.0)

## 2023-03-23 ENCOUNTER — Encounter: Payer: Self-pay | Admitting: Internal Medicine

## 2023-03-25 LAB — CYTOLOGY - PAP
Chlamydia: NEGATIVE
Comment: NEGATIVE
Comment: NEGATIVE
Comment: NEGATIVE
Comment: NORMAL
Diagnosis: NEGATIVE
Diagnosis: REACTIVE
High risk HPV: NEGATIVE
Neisseria Gonorrhea: NEGATIVE
Trichomonas: NEGATIVE

## 2023-04-30 DIAGNOSIS — L720 Epidermal cyst: Secondary | ICD-10-CM | POA: Diagnosis not present

## 2023-04-30 DIAGNOSIS — L700 Acne vulgaris: Secondary | ICD-10-CM | POA: Diagnosis not present

## 2023-05-12 ENCOUNTER — Ambulatory Visit (INDEPENDENT_AMBULATORY_CARE_PROVIDER_SITE_OTHER): Payer: BC Managed Care – PPO | Admitting: Internal Medicine

## 2023-05-12 ENCOUNTER — Other Ambulatory Visit (HOSPITAL_COMMUNITY)
Admission: RE | Admit: 2023-05-12 | Discharge: 2023-05-12 | Disposition: A | Discharge status: Home / Self Care | Payer: BC Managed Care – PPO | Source: Ambulatory Visit | Attending: Internal Medicine | Admitting: Internal Medicine

## 2023-05-12 ENCOUNTER — Encounter: Payer: Self-pay | Admitting: Internal Medicine

## 2023-05-12 VITALS — BP 108/68 | Ht 69.0 in | Wt 160.4 lb

## 2023-05-12 DIAGNOSIS — N898 Other specified noninflammatory disorders of vagina: Secondary | ICD-10-CM | POA: Diagnosis not present

## 2023-05-12 DIAGNOSIS — R82998 Other abnormal findings in urine: Secondary | ICD-10-CM | POA: Diagnosis not present

## 2023-05-12 DIAGNOSIS — F40243 Fear of flying: Secondary | ICD-10-CM

## 2023-05-12 LAB — POCT URINALYSIS DIPSTICK
Bilirubin, UA: NEGATIVE
Blood, UA: NEGATIVE
Glucose, UA: NEGATIVE
Ketones, UA: NEGATIVE
Leukocytes, UA: NEGATIVE
Nitrite, UA: NEGATIVE
Odor: POSITIVE
Protein, UA: NEGATIVE
Spec Grav, UA: 1.01 (ref 1.010–1.025)
Urobilinogen, UA: 0.2 U/dL
pH, UA: 7.5 (ref 5.0–8.0)

## 2023-05-12 MED ORDER — LORAZEPAM 0.5 MG PO TABS: ORAL_TABLET | ORAL | 0 refills | Status: DC

## 2023-05-12 NOTE — Progress Notes (Signed)
Subjective:    Patient ID: Erica Dodson, female    DOB: January 21, 1996, 27 y.o.   MRN: [id number]  HPI  Discussed the use of AI scribe software for clinical note transcription with the patient, who gave verbal consent to proceed.  The patient presents with recent changes in urinary and vaginal symptoms, including darker urine and a sensation of bloating. She also reports an increase in vaginal discharge, which she describes as more watery than usual. She denies any associated odor or abnormality in her menstrual cycle. The patient has not attempted any over-the-counter treatments for these symptoms.   The patient also reports a history of anxiety, particularly in relation to flying. She has previously tried using a CBD gummy to manage this anxiety, but found it exacerbated her panic. She has had a positive experience with ativan in the past. The patient is due to travel to [place] for a month and wishes to have these issues addressed before departure.       Review of Systems   No past medical history on file.  Current Outpatient Medications  Medication Sig Dispense Refill   Biotin 10 MG CAPS Take by mouth.     Norgestimate-Ethinyl Estradiol Triphasic (TRI-SPRINTEC) 0.18/0.215/0.25 MG-35 MCG tablet Take 1 tablet by mouth at bedtime. 28 tablet 11   No current facility-administered medications for this visit.    Allergies  Allergen Reactions   Metronidazole Dermatitis    Intravaginal only.  Has tolerated PO without problems in past.   Sulfa Antibiotics Hives    Family History  Problem Relation Age of Onset   Heart Problems Father    Asthma Brother     Social History   Socioeconomic History   Marital status: Single    Spouse name: Not on file   Number of children: Not on file   Years of education: Not on file   Highest education level: Not on file  Occupational History   Not on file  Tobacco Use   Smoking status: Never   Smokeless tobacco: Never  Vaping Use   Vaping  status: Never Used  Substance and Sexual Activity   Alcohol use: Yes    Alcohol/week: 0.0 standard drinks of alcohol    Comment: occasional   Drug use: No   Sexual activity: Not on file  Other Topics Concern   Not on file  Social History Narrative   Not on file   Social Determinants of Health   Financial Resource Strain: Not on file  Food Insecurity: Not on file  Transportation Needs: Not on file  Physical Activity: Not on file  Stress: Not on file  Social Connections: Not on file  Intimate Partner Violence: Not on file     Constitutional: Denies fever, malaise, fatigue, headache or abrupt weight changes.  Respiratory: Denies difficulty breathing, shortness of breath, cough or sputum production.   Cardiovascular: Denies chest pain, chest tightness, palpitations or swelling in the hands or feet.  Gastrointestinal: Denies abdominal pain, bloating, constipation, diarrhea or blood in the stool.  GU: Patient reports dark urine and vaginal discharge.  Denies urgency, frequency, pain with urination, burning sensation, blood in urine, odor. Neurological: Denies dizziness, difficulty with memory, difficulty with speech or problems with balance and coordination.  Psych: Patient reports slight anxiety.  Denies anxiety, depression, SI/HI.  No other specific complaints in a complete review of systems (except as listed in HPI above).      Objective:   Physical Exam  BP 108/68  Ht 5\' 9"  (1.753 m)   Wt 160 lb 6.4 oz (72.8 kg)   BMI 23.69 kg/m   Wt Readings from Last 3 Encounters:  03/20/23 164 lb (74.4 kg)  02/07/22 157 lb 3.2 oz (71.3 kg)  01/07/22 158 lb (71.7 kg)    General: Appears her stated age, well developed, well nourished in NAD. Cardiovascular: Normal rate and rhythm. S1,S2 noted.  No murmur, rubs or gallops noted.  Pulmonary/Chest: Normal effort and positive vesicular breath sounds. No respiratory distress. No wheezes, rales or ronchi noted.  Abdomen: Soft and  nontender.  No CVA tenderness noted. Pelvic: Self swab. Musculoskeletal: No difficulty with gait.  Neurological: Alert and oriented.   BMET    Component Value Date/Time   NA 137 03/20/2023 1543   K 4.1 03/20/2023 1543   CL 101 03/20/2023 1543   CO2 22 03/20/2023 1543   GLUCOSE 89 03/20/2023 1543   GLUCOSE 97 04/23/2021 1427   BUN 6 03/20/2023 1543   CREATININE 0.83 03/20/2023 1543   CREATININE 0.95 04/23/2021 1427   CALCIUM 9.8 03/20/2023 1543   GFRNONAA 95 03/21/2020 1116   GFRAA 109 03/21/2020 1116    Lipid Panel     Component Value Date/Time   CHOL 132 03/20/2023 1543   TRIG 31 03/20/2023 1543   HDL 54 03/20/2023 1543   CHOLHDL 2.4 03/20/2023 1543   CHOLHDL 2.5 04/23/2021 1427   LDLCALC 69 03/20/2023 1543   LDLCALC 68 04/23/2021 1427    CBC    Component Value Date/Time   WBC 12.8 (H) 03/20/2023 1543   WBC 8.8 04/23/2021 1427   RBC 4.60 03/20/2023 1543   RBC 4.63 04/23/2021 1427   HGB 13.8 03/20/2023 1543   HCT 40.3 03/20/2023 1543   PLT 357 03/20/2023 1543   MCV 88 03/20/2023 1543   MCH 30.0 03/20/2023 1543   MCH 29.8 04/23/2021 1427   MCHC 34.2 03/20/2023 1543   MCHC 34.1 04/23/2021 1427   RDW 12.2 03/20/2023 1543   LYMPHSABS 2.8 03/21/2020 1116   EOSABS 0.1 03/21/2020 1116   BASOSABS 0.0 03/21/2020 1116    Hgb A1C Lab Results  Component Value Date   HGBA1C 4.7 (L) 03/20/2023            Assessment & Plan:  Assessment and Plan    Vaginal Discharge Increased watery discharge, no odor. Suspected BV. -Perform vaginal swab for BV, gonorrhea, chlamydia, trichomonas. -If positive for BV, prescribe oral treatment (patient preference).  Flight Anxiety History of panic reaction to CBD gummy. -Prescribe Ativan 0.5 mg daily.  For use during upcoming flights.  Dark Urine Urinalysis negative for infection. -Push fluids -No further action required at this time.   RTC in 10 months for your annual exam Nicki Reaper, NP

## 2023-05-15 LAB — CERVICOVAGINAL ANCILLARY ONLY
Bacterial Vaginitis (gardnerella): NEGATIVE
Candida Glabrata: NEGATIVE
Candida Vaginitis: NEGATIVE
Chlamydia: NEGATIVE
Comment: NEGATIVE
Comment: NEGATIVE
Comment: NEGATIVE
Comment: NEGATIVE
Comment: NEGATIVE
Comment: NORMAL
Neisseria Gonorrhea: NEGATIVE
Trichomonas: NEGATIVE

## 2023-08-03 DIAGNOSIS — L700 Acne vulgaris: Secondary | ICD-10-CM | POA: Diagnosis not present

## 2023-08-03 DIAGNOSIS — L910 Hypertrophic scar: Secondary | ICD-10-CM | POA: Diagnosis not present

## 2023-08-03 DIAGNOSIS — Z79899 Other long term (current) drug therapy: Secondary | ICD-10-CM | POA: Diagnosis not present

## 2023-08-04 ENCOUNTER — Other Ambulatory Visit: Payer: Self-pay | Admitting: Internal Medicine

## 2023-08-04 DIAGNOSIS — Z3040 Encounter for surveillance of contraceptives, unspecified: Secondary | ICD-10-CM

## 2023-08-04 NOTE — Telephone Encounter (Signed)
 Pt states that she was advised to contact her PCP by her GYN since her PCP did her PAP during her last CPE    Copied from CRM [id number]. Topic: Clinical - Medication Refill >> Aug 04, 2023  9:12 AM Franchot Heidelberg wrote: Most Recent Primary Care Visit:  Provider: Lorre Munroe  Department: [id number]  Visit Type: OFFICE VISIT  Date: 05/12/2023  Medication: Norgestimate-Ethinyl Estradiol Triphasic (TRI-SPRINTEC) 0.18/0.215/0.25 MG-35 MCG tablet  Has the patient contacted their pharmacy? Yes (Agent: If no, request that the patient contact the pharmacy for the refill. If patient does not wish to contact the pharmacy document the reason why and proceed with request.) (Agent: If yes, when and what did the pharmacy advise?)  Is this the correct pharmacy for this prescription? Yes If no, delete pharmacy and type the correct one.  This is the patient's preferred pharmacy:   [id number] - [address] [place] [address] Phone: [phone] Fax: [phone]   Has the prescription been filled recently? Yes  Is the patient out of the medication? Yes  Has the patient been seen for an appointment in the last year OR does the patient have an upcoming appointment? Yes  Can we respond through MyChart? Yes  Agent: Please be advised that Rx refills may take up to 3 business days. We ask that you follow-up with your pharmacy.

## 2023-08-05 MED ORDER — NORGESTIM-ETH ESTRAD TRIPHASIC 0.18/0.215/0.25 MG-35 MCG PO TABS: 1.0000 | ORAL_TABLET | Freq: Every day | ORAL | 2 refills | Status: DC

## 2023-08-05 NOTE — Telephone Encounter (Signed)
 Requested Prescriptions  Pending Prescriptions Disp Refills   Norgestimate-Ethinyl Estradiol Triphasic (TRI-SPRINTEC) 0.18/0.215/0.25 MG-35 MCG tablet 28 tablet 2    Sig: Take 1 tablet by mouth at bedtime.     OB/GYN:  Contraceptives Passed - 08/05/2023 10:42 AM      Passed - Last BP in normal range    BP Readings from Last 1 Encounters:  05/12/23 108/68         Passed - Valid encounter within last 12 months    Recent Outpatient Visits           2 months ago Dark urine   [place] [facility] [place], Salvadore [place], NP   4 months ago Encounter for general adult medical examination with abnormal findings   [place] [facility] [place], Salvadore Oxford, NP   1 year ago Vaginal discharge   [place] [facility] [place], Salvadore [place], NP   1 year ago Sore throat   [place] [facility] [place], Salvadore [place], NP   2 years ago Encounter for general adult medical examination with abnormal findings   [place] [facility] [place], Salvadore [place], [place]              Passed - Patient is not a smoker

## 2023-09-02 DIAGNOSIS — L700 Acne vulgaris: Secondary | ICD-10-CM | POA: Diagnosis not present

## 2023-09-02 DIAGNOSIS — Z79899 Other long term (current) drug therapy: Secondary | ICD-10-CM | POA: Diagnosis not present

## 2023-10-02 DIAGNOSIS — Z79899 Other long term (current) drug therapy: Secondary | ICD-10-CM | POA: Diagnosis not present

## 2023-10-02 DIAGNOSIS — L700 Acne vulgaris: Secondary | ICD-10-CM | POA: Diagnosis not present

## 2023-10-02 DIAGNOSIS — L910 Hypertrophic scar: Secondary | ICD-10-CM | POA: Diagnosis not present

## 2023-10-19 DIAGNOSIS — L905 Scar conditions and fibrosis of skin: Secondary | ICD-10-CM | POA: Diagnosis not present

## 2023-10-29 ENCOUNTER — Other Ambulatory Visit: Payer: Self-pay | Admitting: Family Medicine

## 2023-10-29 DIAGNOSIS — Z3040 Encounter for surveillance of contraceptives, unspecified: Secondary | ICD-10-CM

## 2023-10-30 NOTE — Telephone Encounter (Signed)
 Requested Prescriptions  Pending Prescriptions Disp Refills   TRI-ESTARYLLA 0.18/0.215/0.25 MG-35 MCG tablet [Pharmacy Med Name: TRI-ESTARYLLA TABLET] 28 tablet 2    Sig: TAKE 1 TABLET BY MOUTH EVERYDAY AT BEDTIME     OB/GYN:  Contraceptives Failed - 10/30/2023 11:34 AM      Failed - Valid encounter within last 12 months    Recent Outpatient Visits   None            Passed - Last BP in normal range    BP Readings from Last 1 Encounters:  05/12/23 108/68         Passed - Patient is not a smoker

## 2023-11-03 DIAGNOSIS — L02213 Cutaneous abscess of chest wall: Secondary | ICD-10-CM | POA: Diagnosis not present

## 2023-11-10 DIAGNOSIS — L905 Scar conditions and fibrosis of skin: Secondary | ICD-10-CM | POA: Diagnosis not present

## 2023-11-26 DIAGNOSIS — L0211 Cutaneous abscess of neck: Secondary | ICD-10-CM | POA: Diagnosis not present

## 2024-01-21 DIAGNOSIS — L905 Scar conditions and fibrosis of skin: Secondary | ICD-10-CM | POA: Diagnosis not present

## 2024-06-17 ENCOUNTER — Other Ambulatory Visit (HOSPITAL_COMMUNITY)
Admission: RE | Admit: 2024-06-17 | Discharge: 2024-06-17 | Disposition: A | Source: Ambulatory Visit | Attending: Internal Medicine | Admitting: Internal Medicine

## 2024-06-17 ENCOUNTER — Ambulatory Visit (INDEPENDENT_AMBULATORY_CARE_PROVIDER_SITE_OTHER): Admitting: Internal Medicine

## 2024-06-17 ENCOUNTER — Encounter: Payer: Self-pay | Admitting: Internal Medicine

## 2024-06-17 VITALS — BP 104/70 | Ht 69.0 in | Wt 159.8 lb

## 2024-06-17 DIAGNOSIS — Z23 Encounter for immunization: Secondary | ICD-10-CM | POA: Diagnosis not present

## 2024-06-17 DIAGNOSIS — Z0001 Encounter for general adult medical examination with abnormal findings: Secondary | ICD-10-CM

## 2024-06-17 DIAGNOSIS — Z136 Encounter for screening for cardiovascular disorders: Secondary | ICD-10-CM | POA: Diagnosis not present

## 2024-06-17 DIAGNOSIS — R739 Hyperglycemia, unspecified: Secondary | ICD-10-CM | POA: Diagnosis not present

## 2024-06-17 DIAGNOSIS — Z113 Encounter for screening for infections with a predominantly sexual mode of transmission: Secondary | ICD-10-CM | POA: Insufficient documentation

## 2024-06-17 NOTE — Patient Instructions (Signed)

## 2024-06-17 NOTE — Progress Notes (Signed)
 "  Subjective:    Patient ID: Erica Dodson, female    DOB: 11/05/1995, 29 y.o.   MRN: [id number]  HPI  Patient presents to clinic today for her annual exam.  Flu: never Tetanus: 11/2011 COVID: X 2 Pap smear: 03/2023 Dentist: biannually  Diet: She does eat meat. She consumes fruits and veggies. She rarely eats fried foods. She drinks mostly water, coffee. Exercise: Running  Review of Systems     No past medical history on file.  Current Outpatient Medications  Medication Sig Dispense Refill   Biotin 10 MG CAPS Take by mouth.     LORazepam  (ATIVAN ) 0.5 MG tablet Take 1 tab p.o. 1 hour prior to flying 10 tablet 0   TRI-ESTARYLLA 0.18/0.215/0.25 MG-35 MCG tablet TAKE 1 TABLET BY MOUTH EVERYDAY AT BEDTIME 28 tablet 2   No current facility-administered medications for this visit.    Allergies  Allergen Reactions   Metronidazole  Dermatitis    Intravaginal only.  Has tolerated PO without problems in past.   Sulfa Antibiotics Hives    Family History  Problem Relation Age of Onset   Heart Problems Father    Asthma Brother     Social History   Socioeconomic History   Marital status: Single    Spouse name: Not on file   Number of children: Not on file   Years of education: Not on file   Highest education level: Not on file  Occupational History   Not on file  Tobacco Use   Smoking status: Never   Smokeless tobacco: Never  Vaping Use   Vaping status: Never Used  Substance and Sexual Activity   Alcohol use: Yes    Alcohol/week: 0.0 standard drinks of alcohol    Comment: occasional   Drug use: No   Sexual activity: Not on file  Other Topics Concern   Not on file  Social History Narrative   Not on file   Social Drivers of Health   Tobacco Use: Low Risk (05/12/2023)   Patient History    Smoking Tobacco Use: Never    Smokeless Tobacco Use: Never    Passive Exposure: Not on file  Financial Resource Strain: Low Risk (05/12/2023)   Overall Financial Resource  Strain (CARDIA)    Difficulty of Paying Living Expenses: Not very hard  Food Insecurity: No Food Insecurity (05/12/2023)   Hunger Vital Sign    Worried About Running Out of Food in the Last Year: Never true    Ran Out of Food in the Last Year: Never true  Transportation Needs: No Transportation Needs (05/12/2023)   PRAPARE - Administrator, Civil Service (Medical): No    Lack of Transportation (Non-Medical): No  Physical Activity: Sufficiently Active (05/12/2023)   Exercise Vital Sign    Days of Exercise per Week: 5 days    Minutes of Exercise per Session: 60 min  Stress: No Stress Concern Present (05/12/2023)   [employer] of Occupational Health - Occupational Stress Questionnaire    Feeling of Stress : Only a little  Social Connections: Unknown (05/12/2023)   Social Connection and Isolation Panel    Frequency of Communication with Friends and Family: Never    Frequency of Social Gatherings with Friends and Family: Never    Attends Religious Services: Never    Database Administrator or Organizations: No    Attends Engineer, Structural: Not on file    Marital Status: Not on file  Intimate Partner Violence: Not  At Risk (05/12/2023)   Humiliation, Afraid, Rape, and Kick questionnaire    Fear of Current or Ex-Partner: No    Emotionally Abused: No    Physically Abused: No    Sexually Abused: No  Depression (PHQ2-9): Low Risk (05/12/2023)   Depression (PHQ2-9)    PHQ-2 Score: 0  Alcohol Screen: Low Risk (03/20/2023)   Alcohol Screen    Last Alcohol Screening Score (AUDIT): 1  Housing: Low Risk (05/12/2023)   Housing    Last Housing Risk Score: 0  Utilities: Not At Risk (05/12/2023)   AHC Utilities    Threatened with loss of utilities: No  Health Literacy: Adequate Health Literacy (05/12/2023)   B1300 Health Literacy    Frequency of need for help with medical instructions: Never     Constitutional: Denies fever, malaise, fatigue, headache or abrupt weight  changes.  HEENT: Denies eye pain, eye redness, ear pain, ringing in the ears, wax buildup, runny nose, nasal congestion, bloody nose, or sore throat. Respiratory: Denies difficulty breathing, shortness of breath, cough or sputum production.   Cardiovascular: Denies chest pain, chest tightness, palpitations or swelling in the hands or feet.  Gastrointestinal: Denies abdominal pain, bloating, constipation, diarrhea or blood in the stool.  GU: Denies urgency, frequency, pain with urination, burning sensation, blood in urine, odor or discharge. Musculoskeletal: Denies decrease in range of motion, difficulty with gait, muscle pain or joint pain and swelling.  Skin: Denies redness, rashes, lesions or ulcercations.  Neurological: Denies dizziness, difficulty with memory, difficulty with speech or problems with balance and coordination.  Psych: Denies anxiety, depression, SI/HI.  No other specific complaints in a complete review of systems (except as listed in HPI above).  Objective:   Physical Exam  BP 104/70 (BP Location: Left Arm, Patient Position: Sitting, Cuff Size: Normal)   Ht 5' 9 (1.753 m)   Wt 159 lb 12.8 oz (72.5 kg)   LMP 06/14/2024 (Approximate)   BMI 23.60 kg/m    Wt Readings from Last 3 Encounters:  05/12/23 160 lb 6.4 oz (72.8 kg)  03/20/23 164 lb (74.4 kg)  02/07/22 157 lb 3.2 oz (71.3 kg)    General: Appears her stated age, well developed, well nourished in NAD. Skin: Warm, dry and intact. Breast: Symmetrical. Fibrocystic changes noted of bilateral breast. No nipple discharge. No axillary lymphadenopathy. HEENT: Head: normal shape and size; Eyes: sclera white, no icterus, conjunctiva pink, PERRLA and EOMs intact;  Neck:  Neck supple, trachea midline. No masses, lumps or thyromegaly present.  Cardiovascular: Normal rate and rhythm. S1,S2 noted.  No murmur, rubs or gallops noted. No JVD or BLE edema.  Pulmonary/Chest: Normal effort and positive vesicular breath sounds. No  respiratory distress. No wheezes, rales or ronchi noted.  Abdomen: Soft and nontender. Normal bowel sounds.  Musculoskeletal: Strength 5/5 BUE/BLE. No difficulty with gait.  Neurological: Alert and oriented. Cranial nerves II-XII grossly intact. Coordination normal.  Psychiatric: Mood and affect normal. Behavior is normal. Judgment and thought content normal.    BMET    Component Value Date/Time   NA 137 03/20/2023 1543   K 4.1 03/20/2023 1543   CL 101 03/20/2023 1543   CO2 22 03/20/2023 1543   GLUCOSE 89 03/20/2023 1543   GLUCOSE 97 04/23/2021 1427   BUN 6 03/20/2023 1543   CREATININE 0.83 03/20/2023 1543   CREATININE 0.95 04/23/2021 1427   CALCIUM 9.8 03/20/2023 1543   GFRNONAA 95 03/21/2020 1116   GFRAA 109 03/21/2020 1116    Lipid Panel  Component Value Date/Time   CHOL 132 03/20/2023 1543   TRIG 31 03/20/2023 1543   HDL 54 03/20/2023 1543   CHOLHDL 2.4 03/20/2023 1543   CHOLHDL 2.5 04/23/2021 1427   LDLCALC 69 03/20/2023 1543   LDLCALC 68 04/23/2021 1427    CBC    Component Value Date/Time   WBC 12.8 (H) 03/20/2023 1543   WBC 8.8 04/23/2021 1427   RBC 4.60 03/20/2023 1543   RBC 4.63 04/23/2021 1427   HGB 13.8 03/20/2023 1543   HCT 40.3 03/20/2023 1543   PLT 357 03/20/2023 1543   MCV 88 03/20/2023 1543   MCH 30.0 03/20/2023 1543   MCH 29.8 04/23/2021 1427   MCHC 34.2 03/20/2023 1543   MCHC 34.1 04/23/2021 1427   RDW 12.2 03/20/2023 1543   LYMPHSABS 2.8 03/21/2020 1116   EOSABS 0.1 03/21/2020 1116   BASOSABS 0.0 03/21/2020 1116    Hgb A1C Lab Results  Component Value Date   HGBA1C 4.7 (L) 03/20/2023           Assessment & Plan:   Preventative health maintenance:  Flu shot declined Tetanus today Encouraged her to get her COVID booster Pap smear UTD Encourage her to consume a balanced diet and exercise regimen Advised her to see a dentist annually We will check CBC, c-Met, lipid, A1c today  Screening for STD:  Wet prep today to  check for gonorrhea, chlamydia and trichomonas  RTC in 1 year, sooner if needed Angeline Laura, NP  "

## 2024-06-18 LAB — HEMOGLOBIN A1C
Hgb A1c MFr Bld: 4.8 %
Mean Plasma Glucose: 91 mg/dL
eAG (mmol/L): 5 mmol/L

## 2024-06-18 LAB — LIPID PANEL
Cholesterol: 136 mg/dL
HDL: 65 mg/dL
LDL Cholesterol (Calc): 58 mg/dL
Non-HDL Cholesterol (Calc): 71 mg/dL
Total CHOL/HDL Ratio: 2.1 (calc)
Triglycerides: 51 mg/dL

## 2024-06-18 LAB — CBC
HCT: 40.7 % (ref 35.9–46.0)
Hemoglobin: 13.4 g/dL (ref 11.7–15.5)
MCH: 29.1 pg (ref 27.0–33.0)
MCHC: 32.9 g/dL (ref 31.6–35.4)
MCV: 88.5 fL (ref 81.4–101.7)
MPV: 10.5 fL (ref 7.5–12.5)
Platelets: 306 Thousand/uL (ref 140–400)
RBC: 4.6 Million/uL (ref 3.80–5.10)
RDW: 12 % (ref 11.0–15.0)
WBC: 8.8 Thousand/uL (ref 3.8–10.8)

## 2024-06-18 LAB — COMPREHENSIVE METABOLIC PANEL WITH GFR
AG Ratio: 2 (calc) (ref 1.0–2.5)
ALT: 11 U/L (ref 6–29)
AST: 17 U/L (ref 10–30)
Albumin: 4.7 g/dL (ref 3.6–5.1)
Alkaline phosphatase (APISO): 67 U/L (ref 31–125)
BUN: 12 mg/dL (ref 7–25)
CO2: 27 mmol/L (ref 20–32)
Calcium: 9.4 mg/dL (ref 8.6–10.2)
Chloride: 105 mmol/L (ref 98–110)
Creat: 0.79 mg/dL (ref 0.50–0.97)
Globulin: 2.3 g/dL (ref 1.9–3.7)
Glucose, Bld: 94 mg/dL (ref 65–139)
Potassium: 4.1 mmol/L (ref 3.5–5.3)
Sodium: 139 mmol/L (ref 135–146)
Total Bilirubin: 0.3 mg/dL (ref 0.2–1.2)
Total Protein: 7 g/dL (ref 6.1–8.1)
eGFR: 103 mL/min/1.73m2

## 2024-06-20 ENCOUNTER — Ambulatory Visit: Payer: Self-pay | Admitting: Internal Medicine

## 2024-06-20 LAB — CERVICOVAGINAL ANCILLARY ONLY
Chlamydia: NEGATIVE
Comment: NEGATIVE
Comment: NEGATIVE
Comment: NORMAL
Neisseria Gonorrhea: NEGATIVE
Trichomonas: NEGATIVE

## 2025-06-23 ENCOUNTER — Encounter: Admitting: Internal Medicine
